# Patient Record
Sex: Male | Born: 1970 | Race: Black or African American | Hispanic: No | Marital: Married | Smoking: Never smoker
Health system: Southern US, Community
[De-identification: ages and names within clinical notes are randomized; demographics above are authoritative.]

## PROBLEM LIST (undated history)

## (undated) DIAGNOSIS — Z860100 Personal history of colon polyps, unspecified: Secondary | ICD-10-CM

## (undated) DIAGNOSIS — I10 Essential (primary) hypertension: Secondary | ICD-10-CM

## (undated) DIAGNOSIS — K649 Unspecified hemorrhoids: Secondary | ICD-10-CM

## (undated) DIAGNOSIS — E785 Hyperlipidemia, unspecified: Secondary | ICD-10-CM

## (undated) HISTORY — DX: Unspecified hemorrhoids: K64.9

## (undated) HISTORY — DX: Essential (primary) hypertension: I10

## (undated) HISTORY — PX: VASECTOMY: SHX75

## (undated) HISTORY — DX: Personal history of colon polyps, unspecified: Z86.0100

## (undated) HISTORY — DX: Personal history of colonic polyps: Z86.010

## (undated) HISTORY — PX: OTHER SURGICAL HISTORY: SHX169

## (undated) HISTORY — DX: Hyperlipidemia, unspecified: E78.5

---

## 2003-01-21 ENCOUNTER — Encounter: Admission: RE | Admit: 2003-01-21 | Discharge: 2003-01-21 | Payer: Self-pay | Admitting: Occupational Medicine

## 2005-05-23 ENCOUNTER — Emergency Department (HOSPITAL_COMMUNITY): Admission: EM | Admit: 2005-05-23 | Discharge: 2005-05-23 | Payer: Self-pay | Admitting: Emergency Medicine

## 2005-05-24 ENCOUNTER — Ambulatory Visit: Payer: Self-pay | Admitting: Orthopedic Surgery

## 2005-06-21 ENCOUNTER — Ambulatory Visit: Payer: Self-pay | Admitting: Orthopedic Surgery

## 2007-02-22 ENCOUNTER — Emergency Department (HOSPITAL_COMMUNITY): Admission: EM | Admit: 2007-02-22 | Discharge: 2007-02-22 | Payer: Self-pay | Admitting: Emergency Medicine

## 2010-08-21 ENCOUNTER — Ambulatory Visit (INDEPENDENT_AMBULATORY_CARE_PROVIDER_SITE_OTHER): Payer: BC Managed Care – PPO | Admitting: Urology

## 2010-08-21 DIAGNOSIS — Z3009 Encounter for other general counseling and advice on contraception: Secondary | ICD-10-CM

## 2010-09-25 ENCOUNTER — Encounter (INDEPENDENT_AMBULATORY_CARE_PROVIDER_SITE_OTHER): Payer: BC Managed Care – PPO | Admitting: Urology

## 2010-09-25 DIAGNOSIS — Z302 Encounter for sterilization: Secondary | ICD-10-CM

## 2010-12-11 LAB — URINALYSIS, ROUTINE W REFLEX MICROSCOPIC
Bilirubin Urine: NEGATIVE
Glucose, UA: NEGATIVE
Hgb urine dipstick: NEGATIVE
Ketones, ur: NEGATIVE
Nitrite: NEGATIVE
Protein, ur: NEGATIVE
Specific Gravity, Urine: 1.025
Urobilinogen, UA: 0.2
pH: 6

## 2011-06-14 ENCOUNTER — Other Ambulatory Visit (HOSPITAL_COMMUNITY): Payer: Self-pay | Admitting: Physical Medicine and Rehabilitation

## 2011-06-14 ENCOUNTER — Ambulatory Visit (HOSPITAL_COMMUNITY)
Admission: RE | Admit: 2011-06-14 | Discharge: 2011-06-14 | Disposition: A | Discharge status: Home / Self Care | Payer: 59 | Source: Ambulatory Visit | Attending: Physical Medicine and Rehabilitation | Admitting: Physical Medicine and Rehabilitation

## 2011-06-14 DIAGNOSIS — R209 Unspecified disturbances of skin sensation: Secondary | ICD-10-CM | POA: Insufficient documentation

## 2011-06-14 DIAGNOSIS — M25569 Pain in unspecified knee: Secondary | ICD-10-CM

## 2011-06-14 DIAGNOSIS — M79609 Pain in unspecified limb: Secondary | ICD-10-CM | POA: Insufficient documentation

## 2011-06-14 DIAGNOSIS — M25559 Pain in unspecified hip: Secondary | ICD-10-CM

## 2011-06-14 DIAGNOSIS — M545 Low back pain, unspecified: Secondary | ICD-10-CM | POA: Insufficient documentation

## 2011-07-08 ENCOUNTER — Other Ambulatory Visit (HOSPITAL_COMMUNITY): Payer: Self-pay | Admitting: Physical Medicine and Rehabilitation

## 2011-07-08 DIAGNOSIS — IMO0002 Reserved for concepts with insufficient information to code with codable children: Secondary | ICD-10-CM

## 2011-07-08 DIAGNOSIS — M79609 Pain in unspecified limb: Secondary | ICD-10-CM

## 2011-07-09 ENCOUNTER — Other Ambulatory Visit (HOSPITAL_COMMUNITY): Payer: 59

## 2011-07-09 ENCOUNTER — Ambulatory Visit (HOSPITAL_COMMUNITY)
Admission: RE | Admit: 2011-07-09 | Discharge: 2011-07-09 | Disposition: A | Discharge status: Home / Self Care | Payer: 59 | Source: Ambulatory Visit | Attending: Physical Medicine and Rehabilitation | Admitting: Physical Medicine and Rehabilitation

## 2011-07-09 DIAGNOSIS — M5126 Other intervertebral disc displacement, lumbar region: Secondary | ICD-10-CM | POA: Insufficient documentation

## 2011-07-09 DIAGNOSIS — IMO0002 Reserved for concepts with insufficient information to code with codable children: Secondary | ICD-10-CM

## 2011-07-09 DIAGNOSIS — R209 Unspecified disturbances of skin sensation: Secondary | ICD-10-CM | POA: Insufficient documentation

## 2011-07-09 DIAGNOSIS — M79609 Pain in unspecified limb: Secondary | ICD-10-CM | POA: Insufficient documentation

## 2011-07-09 DIAGNOSIS — M545 Low back pain, unspecified: Secondary | ICD-10-CM | POA: Insufficient documentation

## 2012-03-03 ENCOUNTER — Emergency Department (HOSPITAL_COMMUNITY)
Admission: EM | Admit: 2012-03-03 | Discharge: 2012-03-04 | Disposition: A | Discharge status: Home / Self Care | Payer: 59 | Attending: Emergency Medicine | Admitting: Emergency Medicine

## 2012-03-03 ENCOUNTER — Emergency Department (HOSPITAL_COMMUNITY): Payer: 59

## 2012-03-03 ENCOUNTER — Encounter (HOSPITAL_COMMUNITY): Payer: Self-pay | Admitting: Emergency Medicine

## 2012-03-03 DIAGNOSIS — B349 Viral infection, unspecified: Secondary | ICD-10-CM

## 2012-03-03 DIAGNOSIS — R51 Headache: Secondary | ICD-10-CM | POA: Insufficient documentation

## 2012-03-03 DIAGNOSIS — R197 Diarrhea, unspecified: Secondary | ICD-10-CM | POA: Insufficient documentation

## 2012-03-03 DIAGNOSIS — R52 Pain, unspecified: Secondary | ICD-10-CM | POA: Insufficient documentation

## 2012-03-03 DIAGNOSIS — R509 Fever, unspecified: Secondary | ICD-10-CM | POA: Insufficient documentation

## 2012-03-03 DIAGNOSIS — B9789 Other viral agents as the cause of diseases classified elsewhere: Secondary | ICD-10-CM | POA: Insufficient documentation

## 2012-03-03 DIAGNOSIS — R5381 Other malaise: Secondary | ICD-10-CM | POA: Insufficient documentation

## 2012-03-03 NOTE — ED Notes (Signed)
PT. REPORTS DRY COUGH SLIGHT SOB WITH FEVER ,HEADACHE AND BODY ACHES ONSET YESTERDAY UNRELIEVED BY OTC TYLENOL.

## 2012-03-04 MED ORDER — ALBUTEROL SULFATE HFA 108 (90 BASE) MCG/ACT IN AERS
2.0000 | INHALATION_SPRAY | RESPIRATORY_TRACT | Status: DC | PRN
  Administered 2012-03-04: 2 via RESPIRATORY_TRACT
  Filled 2012-03-04: qty 6.7

## 2012-03-04 MED ORDER — METOCLOPRAMIDE HCL 5 MG/ML IJ SOLN
10.0000 mg | Freq: Once | INTRAMUSCULAR | Status: AC
  Administered 2012-03-04: 10 mg via INTRAMUSCULAR
  Filled 2012-03-04: qty 2

## 2012-03-04 MED ORDER — HYDROCOD POLST-CHLORPHEN POLST 10-8 MG/5ML PO LQCR: 5.0000 mL | Freq: Two times a day (BID) | ORAL | Status: DC | PRN

## 2012-03-04 MED ORDER — IBUPROFEN 800 MG PO TABS: 800.0000 mg | ORAL_TABLET | Freq: Three times a day (TID) | ORAL | Status: DC

## 2012-03-04 NOTE — ED Provider Notes (Signed)
History     CSN: [id number]  Arrival date & time 03/03/12  2109   None     Chief Complaint  Patient presents with  . Cough    (Consider location/radiation/quality/duration/timing/severity/associated sxs/prior treatment) HPI History provided by pt.   Pt has had a cough for the past 2 days.  Associated w/ frontal headache, body aches, mild diarrhea and fever, max temp 102.  Denies nasal congestion, rhinorrhea, sore throat, CP, SOB and N/V.  Has taken theraflu w/ some relief.  No known sick contacts.  Immunocompetent.   History reviewed. No pertinent past medical history.  History reviewed. No pertinent past surgical history.  No family history on file.  History  Substance Use Topics  . Smoking status: Never Smoker   . Smokeless tobacco: Not on file  . Alcohol Use: No      Review of Systems  All other systems reviewed and are negative.    Allergies  Review of patient's allergies indicates no known allergies.  Home Medications   Current Outpatient Rx  Name  Route  Sig  Dispense  Refill  . VERAPAMIL HCL ER 240 MG PO TBCR   Oral   Take 240 mg by mouth daily.         [name] Kitchen HYDROCOD POLST-CPM POLST ER 10-8 MG/5ML PO LQCR   Oral   Take 5 mLs by mouth every 12 (twelve) hours as needed.   60 mL   0   . IBUPROFEN 800 MG PO TABS   Oral   Take 1 tablet (800 mg total) by mouth 3 (three) times daily.   12 tablet   0     BP 135/75  Pulse 91  Temp 98.7 F (37.1 C) (Oral)  Resp 18  SpO2 96%  Physical Exam  Nursing note and vitals reviewed. Constitutional: He is oriented to person, place, and time. He appears well-developed and well-nourished. No distress.  HENT:  Head: Normocephalic and atraumatic. No trismus in the jaw.  Mouth/Throat: Uvula is midline and mucous membranes are normal. No posterior oropharyngeal edema or posterior oropharyngeal erythema.         No erythema of posterior pharynx. Tonsils symmetric and w/out edema/exudate.  Uvula mid-line.  No  trismus.  No nasal discharge.   Eyes:       Normal appearance  Neck: Normal range of motion. Neck supple.  Cardiovascular: Normal rate and regular rhythm.   Pulmonary/Chest: Effort normal and breath sounds normal.       minimal coughing  Musculoskeletal: Normal range of motion.  Lymphadenopathy:    He has no cervical adenopathy.  Neurological: He is alert and oriented to person, place, and time.  Skin: Skin is warm and dry. No rash noted.  Psychiatric: He has a normal mood and affect. His behavior is normal.    ED Course  Procedures (including critical care time)  Labs Reviewed - No data to display Dg Chest 2 View  03/03/2012  *RADIOLOGY REPORT*  Clinical Data: Cough  CHEST - 2 VIEW  Comparison: None  Findings: Mild cardiac enlargement.  No effusions or edema.  No airspace consolidation identified.  Review of the visualized osseous structures is unremarkable.  IMPRESSION:  1.  No acute cardiopulmonary abnormalities.   Original Report Authenticated By: Signa Kell, M.D.      1. Viral syndrome       MDM  Healthy 41yo M presents w/ s/sx most consistent w/ viral URI, likely influenza.  Afebrile, well-appearing, no focal neuro deficits or  meningeal signs, no respiratory distress on exam.  CXR neg.  Received IM reglan for headache.  D/c'd home w/ tussionex and albuterol inhaler.  Return precautions discussed.         Otilio Miu, PA-C 03/04/12 [phone]

## 2012-03-07 NOTE — ED Provider Notes (Signed)
Medical screening examination/treatment/procedure(s) were performed by non-physician practitioner and as supervising physician I was immediately available for consultation/collaboration.  Kammie Scioli K Givanni Staron, MD 03/07/12 0826 

## 2012-07-16 ENCOUNTER — Encounter (HOSPITAL_COMMUNITY): Payer: Self-pay | Admitting: Emergency Medicine

## 2012-07-16 ENCOUNTER — Emergency Department (HOSPITAL_COMMUNITY): Payer: Managed Care, Other (non HMO)

## 2012-07-16 ENCOUNTER — Emergency Department (HOSPITAL_COMMUNITY)
Admission: EM | Admit: 2012-07-16 | Discharge: 2012-07-16 | Disposition: A | Discharge status: Home / Self Care | Payer: Managed Care, Other (non HMO) | Attending: Emergency Medicine | Admitting: Emergency Medicine

## 2012-07-16 DIAGNOSIS — S93609A Unspecified sprain of unspecified foot, initial encounter: Secondary | ICD-10-CM | POA: Insufficient documentation

## 2012-07-16 DIAGNOSIS — Y9239 Other specified sports and athletic area as the place of occurrence of the external cause: Secondary | ICD-10-CM | POA: Insufficient documentation

## 2012-07-16 DIAGNOSIS — Z79899 Other long term (current) drug therapy: Secondary | ICD-10-CM | POA: Insufficient documentation

## 2012-07-16 DIAGNOSIS — X500XXA Overexertion from strenuous movement or load, initial encounter: Secondary | ICD-10-CM | POA: Insufficient documentation

## 2012-07-16 DIAGNOSIS — S96911A Strain of unspecified muscle and tendon at ankle and foot level, right foot, initial encounter: Secondary | ICD-10-CM

## 2012-07-16 DIAGNOSIS — Z791 Long term (current) use of non-steroidal anti-inflammatories (NSAID): Secondary | ICD-10-CM | POA: Insufficient documentation

## 2012-07-16 DIAGNOSIS — Y9364 Activity, baseball: Secondary | ICD-10-CM | POA: Insufficient documentation

## 2012-07-16 NOTE — ED Notes (Signed)
Patient states he was playing softball yesterday and states he felt and heard a pop on the bottom of his right foot in his arch.

## 2012-07-16 NOTE — ED Provider Notes (Signed)
History  This chart was scribed for Geoffery Lyons, MD by Shari Heritage, ED Scribe. The patient was seen in room APA07/APA07. Patient's care was started at 0703.   CSN: [id number]  Arrival date & time 07/16/12  4098   First MD Initiated Contact with Patient 07/16/12 0703      Chief Complaint  Patient presents with  . Foot Pain    The history is provided by the patient. No language interpreter was used.    HPI Comments: Danny Rivers is a 42 y.o. male who presents to the Emergency Department complaining of moderate, constant, non-radiating right foot pain to the plantar surface of the heel onset yesterday. Patient states that pain began when he was running while playing softball and felt "something pop." He denies any ankle pain or dorsal foot pain. He has no history of prior injury to the area. There is no numbness, weakness, nausea, vomiting, fever or chills. Patient has no chronic medical conditions. Patient does not smoke or use smokeless tobacco.  History reviewed. No pertinent past medical history.  History reviewed. No pertinent past surgical history.  No family history on file.  History  Substance Use Topics  . Smoking status: Never Smoker   . Smokeless tobacco: Not on file  . Alcohol Use: Yes      Review of Systems  Constitutional: Negative for fever and chills.  Gastrointestinal: Negative for nausea and vomiting.  Neurological: Negative for weakness and numbness.  Hematological: Negative.     Allergies  Review of patient's allergies indicates no known allergies.  Home Medications   Current Outpatient Rx  Name  Route  Sig  Dispense  Refill  . chlorpheniramine-HYDROcodone (TUSSIONEX PENNKINETIC ER) 10-8 MG/5ML LQCR   Oral   Take 5 mLs by mouth every 12 (twelve) hours as needed.   60 mL   0   . ibuprofen (ADVIL,MOTRIN) 800 MG tablet   Oral   Take 1 tablet (800 mg total) by mouth 3 (three) times daily.   12 tablet   0   . verapamil (CALAN-SR) 240 MG CR  tablet   Oral   Take 240 mg by mouth daily.           Triage Vitals: BP 141/87  Pulse 87  Temp(Src) 98.8 F (37.1 C) (Oral)  Resp 20  Ht 6\' 2"  (1.88 m)  Wt 236 lb (107.049 kg)  BMI 30.29 kg/m2  SpO2 98%  Physical Exam  Constitutional: He is oriented to person, place, and time. He appears well-developed and well-nourished.  HENT:  Head: Normocephalic and atraumatic.  Eyes: Conjunctivae and EOM are normal. Pupils are equal, round, and reactive to light.  Neck: Normal range of motion. Neck supple.  Pulmonary/Chest: No respiratory distress.  Abdominal: He exhibits no distension.  Musculoskeletal: Normal range of motion.       Right foot: He exhibits tenderness and swelling.  Right foot is noted to have tenderness and swelling on the plantar aspect just medial and distal to the calcaneus. No ecchymosis.   Neurological: He is alert and oriented to person, place, and time.  Skin: Skin is warm and dry. No rash noted. No erythema.    ED Course  Procedures (including critical care time) DIAGNOSTIC STUDIES: Oxygen Saturation is 98% on room air, normal by my interpretation.    COORDINATION OF CARE: 7:11 AM- Patient informed of current plan for treatment and evaluation and agrees with plan at this time.    No results found.   No  diagnosis found.    MDM  Xrays are negative.  Strain of arch of foot, will treat with ice, rest, nsaids.  Follow up prn.      I personally performed the services described in this documentation, which was scribed in my presence. The recorded information has been reviewed and is accurate.     Geoffery Lyons, MD 07/16/12 [phone]

## 2013-05-10 ENCOUNTER — Ambulatory Visit (INDEPENDENT_AMBULATORY_CARE_PROVIDER_SITE_OTHER): Payer: BC Managed Care – PPO | Admitting: Family Medicine

## 2013-05-10 ENCOUNTER — Encounter: Payer: Self-pay | Admitting: Family Medicine

## 2013-05-10 VITALS — BP 122/82 | Temp 98.3°F | Ht 74.0 in | Wt 246.5 lb

## 2013-05-10 DIAGNOSIS — K5289 Other specified noninfective gastroenteritis and colitis: Secondary | ICD-10-CM

## 2013-05-10 DIAGNOSIS — K529 Noninfective gastroenteritis and colitis, unspecified: Secondary | ICD-10-CM

## 2013-05-10 MED ORDER — DIPHENOXYLATE-ATROPINE 2.5-0.025 MG PO TABS: 1.0000 | ORAL_TABLET | Freq: Four times a day (QID) | ORAL | Status: DC | PRN

## 2013-05-10 MED ORDER — ONDANSETRON 4 MG PO TBDP: 4.0000 mg | ORAL_TABLET | Freq: Four times a day (QID) | ORAL | Status: DC | PRN

## 2013-05-10 NOTE — Progress Notes (Signed)
   Subjective:    Patient ID: Danny Rivers, male    DOB: 05-Sep-1970, 43 y.o.   MRN: [id number]  Diarrhea  This is a new problem. The current episode started in the past 7 days. The problem has been unchanged. The stool consistency is described as watery. Associated symptoms include bloating, a fever, headaches and vomiting. Nothing aggravates the symptoms. There are no known risk factors. He has tried increased fluids for the symptoms. The treatment provided no relief.   Patient states he has no other concerns at this time during this visit.  Started sat night  Felt real nauseated and bad  Got hot and clammy, then vom from that  Diarrhea Sunday  Terrible diarrhe this wk  Stomach bubbling and gassy  No meds  Nausea still comes and goes, vom first two days  Appetite soso  Review of Systems  Constitutional: Positive for fever.  Gastrointestinal: Positive for vomiting, diarrhea and bloating.  Neurological: Positive for headaches.   no fever no chest pain no back pain no cough otherwise negative     Objective:   Physical Exam Alert hydration good. Vital stable. HEENT normal. Lungs clear. Heart regular in rhythm. Abdomen hyperactive bowel sounds. Diffuse mild tenderness. No discrete tenderness.       Assessment & Plan:  Impression viral gastroenteritis plan Lomotil when necessary. Zofran when necessary. Diet discussed warning signs discussed. [facility]

## 2013-07-25 IMAGING — CR DG FOOT COMPLETE 3+V*R*
3 series · 3 of 3 positions shown · non-contrast
Comparison: None

CLINICAL DATA: Right foot injury and pain.

RIGHT FOOT COMPLETE - 3+ VIEW

[view not recorded (1 of 3)]
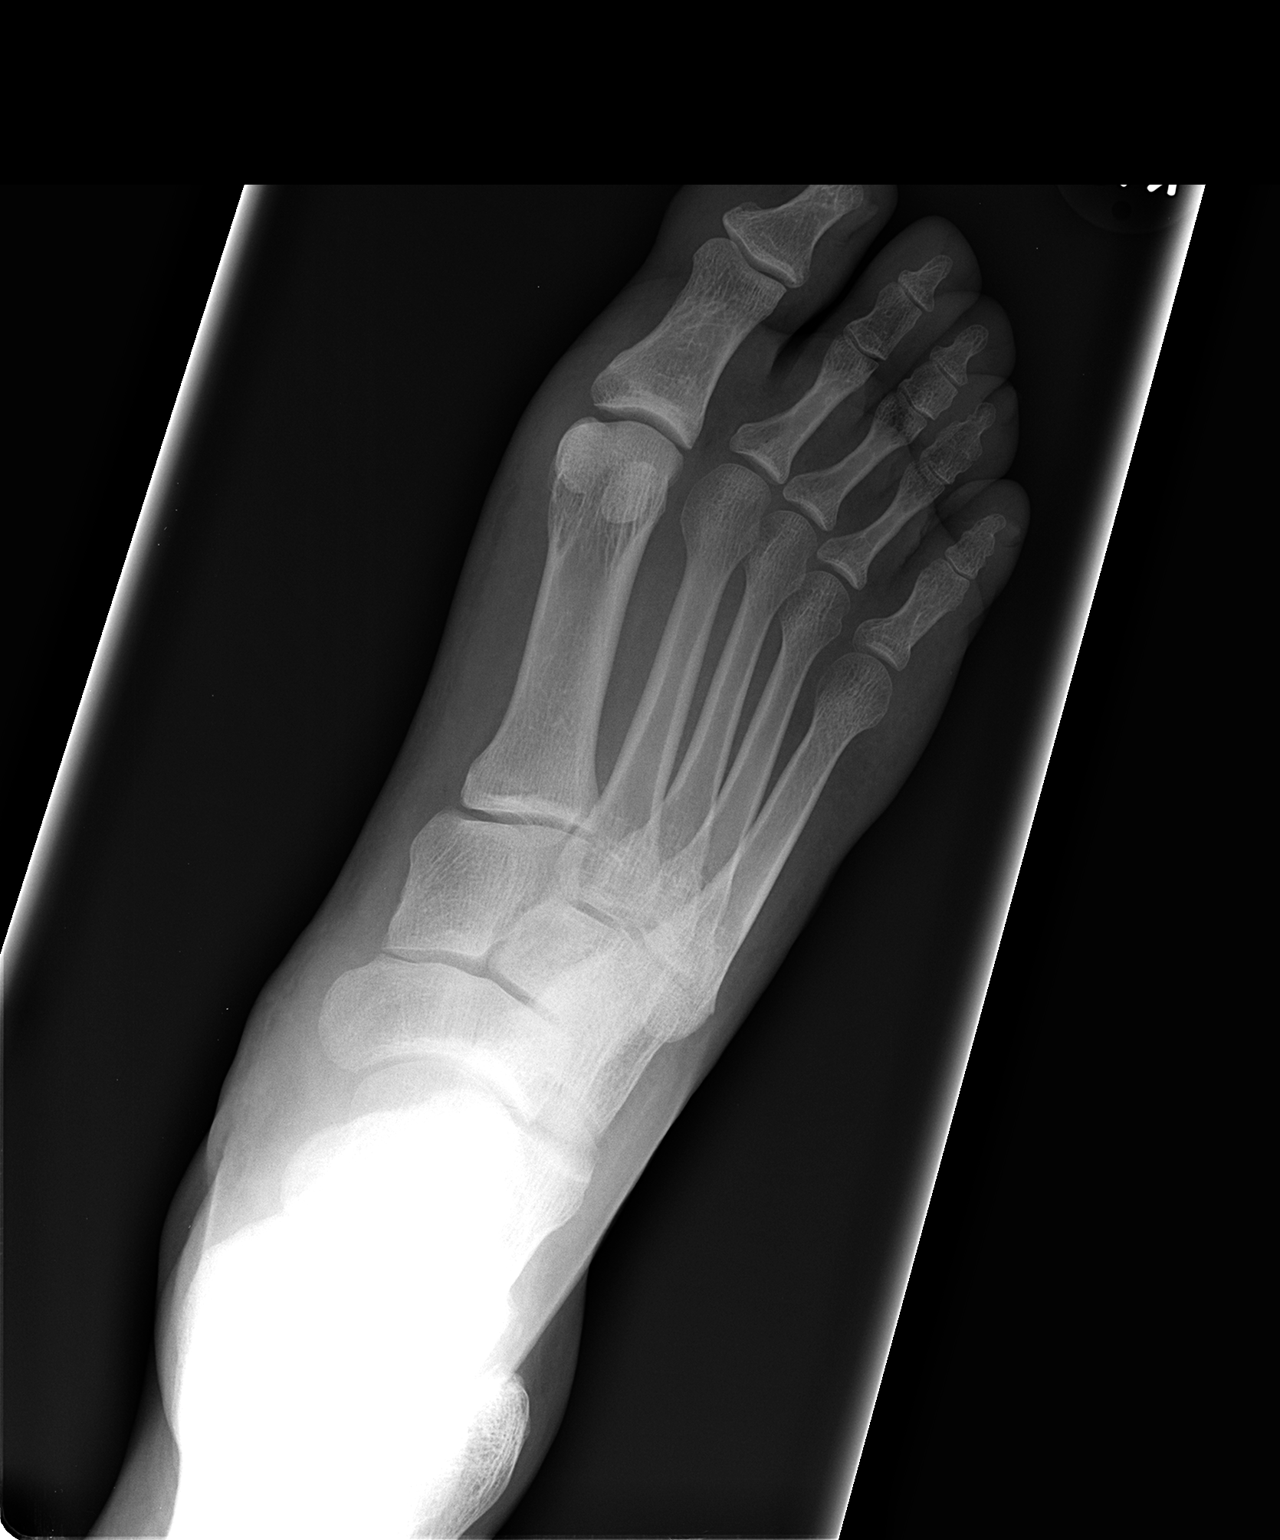

[view not recorded (2 of 3)]
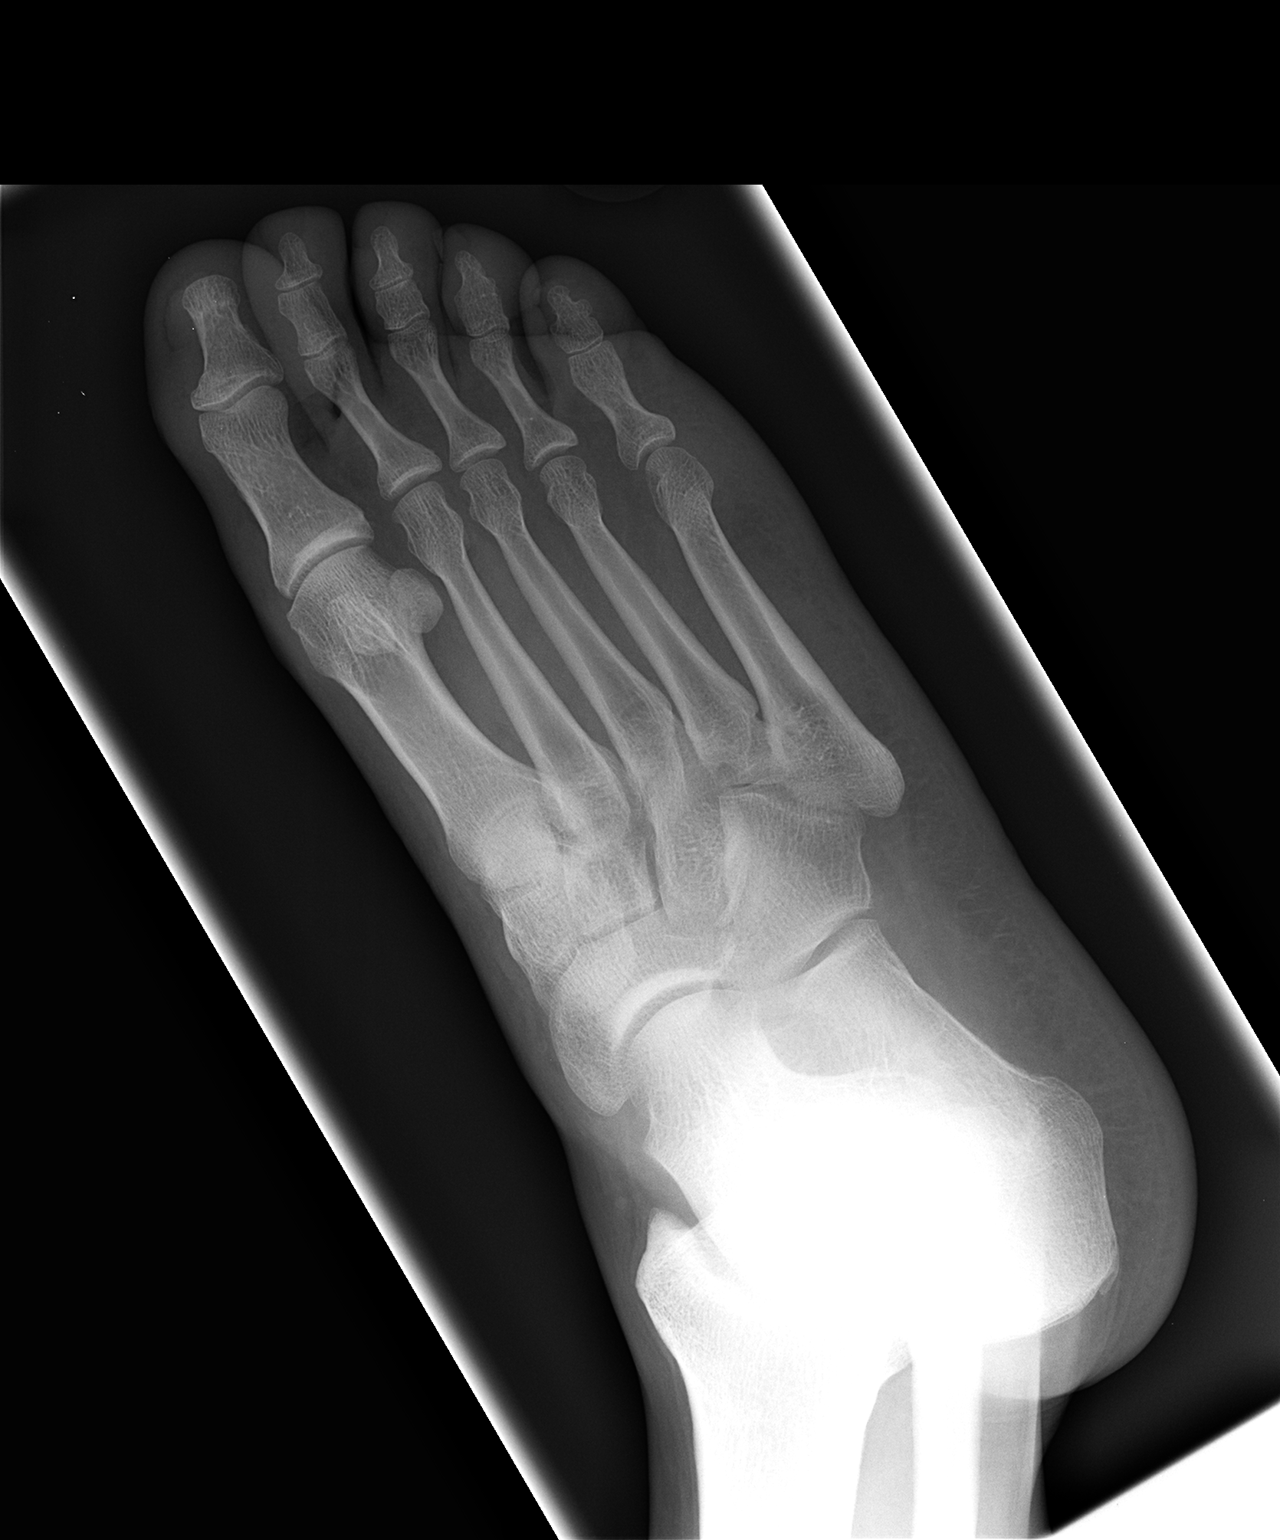

[view not recorded (3 of 3)]
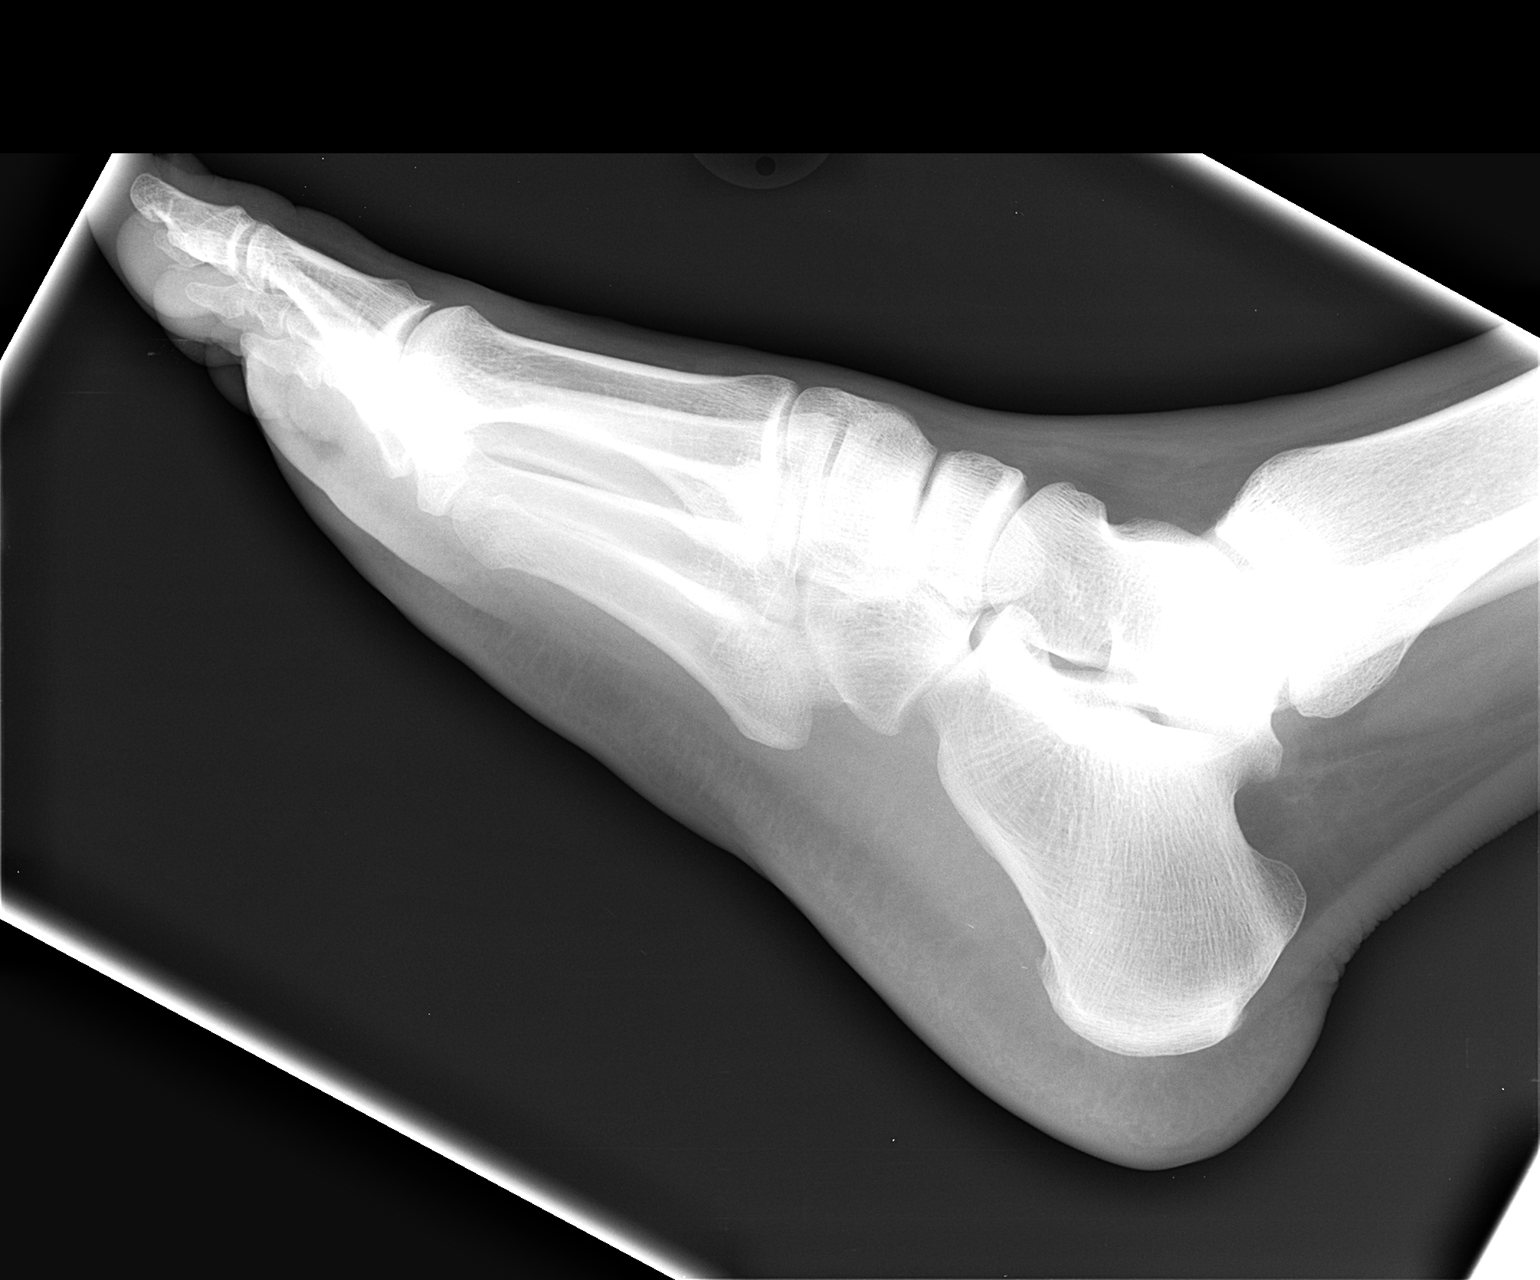

[3 of 3 positions shown; findings below may reference images not displayed]

FINDINGS: There is no evidence of acute fracture, subluxation, or
dislocation.
The Lisfranc joints are intact.
No focal bony lesions are identified.
There is no evidence of radiopaque foreign body.

The joint spaces are unremarkable.
IMPRESSION: No evidence of bony abnormality.

## 2015-03-09 HISTORY — PX: HEMORRHOID BANDING: SHX5850

## 2015-05-08 ENCOUNTER — Encounter: Payer: Self-pay | Admitting: Family Medicine

## 2015-05-08 ENCOUNTER — Ambulatory Visit (INDEPENDENT_AMBULATORY_CARE_PROVIDER_SITE_OTHER): Payer: BLUE CROSS/BLUE SHIELD | Admitting: Family Medicine

## 2015-05-08 VITALS — BP 142/84 | Temp 98.0°F | Ht 74.0 in | Wt 272.4 lb

## 2015-05-08 DIAGNOSIS — Z139 Encounter for screening, unspecified: Secondary | ICD-10-CM | POA: Diagnosis not present

## 2015-05-08 DIAGNOSIS — K921 Melena: Secondary | ICD-10-CM

## 2015-05-08 DIAGNOSIS — K644 Residual hemorrhoidal skin tags: Secondary | ICD-10-CM

## 2015-05-08 DIAGNOSIS — K648 Other hemorrhoids: Secondary | ICD-10-CM | POA: Diagnosis not present

## 2015-05-08 NOTE — Progress Notes (Signed)
   Subjective:    Patient ID: Danny Rivers, male    DOB: [birth year], 45 y.o.   MRN: [id number]  HPI Patient in today for blood in stools. Onset 5 years ago. Patient states symptoms come and go. States amounts vary. Has hx of hemorrhoids.  Pt has intermittent blood sometimes not to discolor the commode. Often bright red but sometimes dark. No major abdominal pain. No perirectal pain. Positive intermittent constipation. Positive known hemorrhoids. This is been going on but worse in recent year.  mixtur  tingred   No pain.  States no other concerns this visit.  Once twice per wk  No colon canc er  No prostate cancer  [phone]  Review of Systems No headache no chest pain no back pain no abdominal pain no change in bowel habits other than noted    Objective:   Physical Exam Alert vitals stable blood pressure initially elevated fine on repeat HEENT normal lungs clear heart rare rhythm rectal exam impressive external hemorrhoids prostate gland within normal limits       Assessment & Plan:  Impression hematochezia by history #2 significant external hemorrhoids with potential internal hemorrhoids #3 elevated blood pressure resolved plan long discussion held. No bleeding likely coming from hemorrhoids cannot be 100% sure. In addition hemorrhoids are better enough to get input from the GI doctors about potential interventions [facility] GI referral rationale discussed multiple questions answered 25 minutes spent most in discussion

## 2015-05-12 ENCOUNTER — Encounter: Payer: Self-pay | Admitting: Family Medicine

## 2015-05-13 ENCOUNTER — Encounter: Payer: Self-pay | Admitting: Gastroenterology

## 2015-05-22 ENCOUNTER — Ambulatory Visit (INDEPENDENT_AMBULATORY_CARE_PROVIDER_SITE_OTHER): Payer: BLUE CROSS/BLUE SHIELD | Admitting: Family Medicine

## 2015-05-22 ENCOUNTER — Encounter: Payer: Self-pay | Admitting: Family Medicine

## 2015-05-22 VITALS — BP 142/86 | Temp 98.6°F | Ht 74.0 in | Wt 266.0 lb

## 2015-05-22 DIAGNOSIS — J329 Chronic sinusitis, unspecified: Secondary | ICD-10-CM

## 2015-05-22 MED ORDER — AMOXICILLIN-POT CLAVULANATE 875-125 MG PO TABS: 1.0000 | ORAL_TABLET | Freq: Two times a day (BID) | ORAL | Status: AC

## 2015-05-22 MED ORDER — ALBUTEROL SULFATE HFA 108 (90 BASE) MCG/ACT IN AERS: 2.0000 | INHALATION_SPRAY | Freq: Four times a day (QID) | RESPIRATORY_TRACT | Status: DC | PRN

## 2015-05-22 NOTE — Progress Notes (Signed)
   Subjective:    Patient ID: Danny Rivers, male    DOB: 1970/11/27, 45 y.o.   MRN: [id number]  Cough This is a new problem. Episode onset: 4 days. Associated symptoms include headaches, nasal congestion and wheezing. Treatments tried: mucinex, tylenol.    Sneezing and dranage  Bad headache over all   Tylenol for h a    No body aches   Energy level  appet ite ok  Cough bad at times  Review of Systems  Respiratory: Positive for cough and wheezing.   Neurological: Positive for headaches.       Objective:   Physical Exam   Alert, mild malaise. Hydration good Vitals stable. frontal/ maxillary tenderness evident positive nasal congestion. pharynx normal neck supple  lungs mild wheeziness with cough otherwise clear/no crackles or wheezes. heart regular in rhythm      Assessment & Plan:  Impression rhinosinusitis/bronchitis with reactive airways plan antibiotics prescribed. Symptom care discussed albuterol when necessary for wheezing [facility]

## 2015-05-26 ENCOUNTER — Ambulatory Visit (INDEPENDENT_AMBULATORY_CARE_PROVIDER_SITE_OTHER): Payer: BLUE CROSS/BLUE SHIELD | Admitting: Gastroenterology

## 2015-05-26 ENCOUNTER — Encounter: Payer: Self-pay | Admitting: Gastroenterology

## 2015-05-26 ENCOUNTER — Other Ambulatory Visit: Payer: Self-pay

## 2015-05-26 VITALS — BP 162/89 | HR 69 | Temp 97.3°F | Ht 74.0 in | Wt 266.2 lb

## 2015-05-26 DIAGNOSIS — K625 Hemorrhage of anus and rectum: Secondary | ICD-10-CM | POA: Diagnosis not present

## 2015-05-26 DIAGNOSIS — K649 Unspecified hemorrhoids: Secondary | ICD-10-CM | POA: Diagnosis not present

## 2015-05-26 HISTORY — DX: Hemorrhage of anus and rectum: K62.5

## 2015-05-26 MED ORDER — PEG 3350-KCL-NA BICARB-NACL 420 G PO SOLR: 4000.0000 mL | Freq: Once | ORAL | Status: DC

## 2015-05-26 NOTE — Patient Instructions (Signed)
1. Colonoscopy with Dr. Rourk. Please see separate instructions. 

## 2015-05-26 NOTE — Assessment & Plan Note (Signed)
45 year old gentleman with chronic intermittent rectal bleeding likely due to benign anorectal source, hemorrhoids. No prior colonoscopy. Recommend colonoscopy for diagnostic purposes. Discussed with patient, based on findings, he may be a candidate for office based hemorrhoid banding.  I have discussed the risks, alternatives, benefits with regards to but not limited to the risk of reaction to medication, bleeding, infection, perforation and the patient is agreeable to proceed. Written consent to be obtained.

## 2015-05-26 NOTE — Progress Notes (Addendum)
Primary Care Physician:  Mickie Hillier, MD  Primary Gastroenterologist:  Garfield Cornea, MD   Chief Complaint  Patient presents with  . Hemorrhoids    HPI:  Danny Rivers is a 45 y.o. male here at the request of Dr. Mickie Hillier for further evaluation of rectal bleeding and hemorrhoids. Patient gives history of rectal bleeding this been intermittent for several years. Sees blood in the stool, on the tissue. Sometimes stains his underwear. Denies any rectal pain, constipation, diarrhea, abdominal pain, upper GI symptoms. No weight loss. No family history of colon cancer. Patient prefers Dr. Gala Romney, because he personally knows Dr. Oneida Alar. Recent digital rectal exam by PCP with "impressive external hemorrhoids".     Current Outpatient Prescriptions  Medication Sig Dispense Refill  . albuterol (PROVENTIL HFA;VENTOLIN HFA) 108 (90 Base) MCG/ACT inhaler Inhale 2 puffs into the lungs every 6 (six) hours as needed for wheezing or shortness of breath. 1 Inhaler 2  . amoxicillin-clavulanate (AUGMENTIN) 875-125 MG tablet Take 1 tablet by mouth 2 (two) times daily. 20 tablet 0  . polyethylene glycol-electrolytes (NULYTELY/GOLYTELY) 420 g solution Take 4,000 mLs by mouth once. 4000 mL 0   No current facility-administered medications for this visit.    Allergies as of 05/26/2015  . (No Known Allergies)    No past medical history on file.  Past Surgical History  Procedure Laterality Date  . Vastectomy      Family History  Problem Relation Age of Onset  . Colon cancer Neg Hx     Social History   Social History  . Marital Status: Divorced    Spouse Name: N/A  . Number of Children: 5  . Years of Education: N/A   Occupational History  . [employer]    Social History Main Topics  . Smoking status: Never Smoker   . Smokeless tobacco: Not on file  . Alcohol Use: No  . Drug Use: No  . Sexual Activity: Not on file   Other Topics Concern  . Not on file   Social History  Narrative      ROS:  General: Negative for anorexia, weight loss, fever, chills, fatigue, weakness. Eyes: Negative for vision changes.  ENT: Negative for hoarseness, difficulty swallowing , nasal congestion. CV: Negative for chest pain, angina, palpitations, dyspnea on exertion, peripheral edema.  Respiratory: Negative for dyspnea at rest, dyspnea on exertion, cough, sputum, wheezing.  GI: See history of present illness. GU:  Negative for dysuria, hematuria, urinary incontinence, urinary frequency, nocturnal urination.  MS: Negative for joint pain, low back pain.  Derm: Negative for rash or itching.  Neuro: Negative for weakness, abnormal sensation, seizure, frequent headaches, memory loss, confusion.  Psych: Negative for anxiety, depression, suicidal ideation, hallucinations.  Endo: Negative for unusual weight change.  Heme: Negative for bruising or bleeding. Allergy: Negative for rash or hives.    Physical Examination:  BP 162/89 mmHg  Pulse 69  Temp(Src) 97.3 F (36.3 C) (Oral)  Ht 6\' 2"  (1.88 m)  Wt 266 lb 3.2 oz (120.748 kg)  BMI 34.16 kg/m2   General: Well-nourished, well-developed in no acute distress.  Head: Normocephalic, atraumatic.   Eyes: Conjunctiva pink, no icterus. Mouth: Oropharyngeal mucosa moist and pink , no lesions erythema or exudate. Neck: Supple without thyromegaly, masses, or lymphadenopathy.  Lungs: Clear to auscultation bilaterally.  Heart: Regular rate and rhythm, no murmurs rubs or gallops.  Abdomen: Bowel sounds are normal, nontender, nondistended, no hepatosplenomegaly or masses, no abdominal bruits or    hernia ,  no rebound or guarding.   Rectal: Deferred Extremities: No lower extremity edema. No clubbing or deformities.  Neuro: Alert and oriented x 4 , grossly normal neurologically.  Skin: Warm and dry, no rash or jaundice.   Psych: Alert and cooperative, normal mood and affect.  Labs: No results found for: CREATININE, BUN, NA, K, CL,  CO2 No results found for: ALT, AST, GGT, ALKPHOS, BILITOT   Imaging Studies: No results found.

## 2015-05-26 NOTE — Progress Notes (Signed)
CC'ED TO PCP 

## 2015-06-03 ENCOUNTER — Ambulatory Visit: Payer: Managed Care, Other (non HMO) | Admitting: Nurse Practitioner

## 2015-06-13 ENCOUNTER — Encounter (HOSPITAL_COMMUNITY)
Admission: RE | Disposition: A | Discharge status: Home Health | Payer: Self-pay | Source: Ambulatory Visit | Attending: Internal Medicine

## 2015-06-13 ENCOUNTER — Encounter (HOSPITAL_COMMUNITY): Payer: Self-pay | Admitting: *Deleted

## 2015-06-13 ENCOUNTER — Ambulatory Visit (HOSPITAL_COMMUNITY)
Admission: RE | Admit: 2015-06-13 | Discharge: 2015-06-13 | Disposition: A | Discharge status: Home Health | Payer: BLUE CROSS/BLUE SHIELD | Source: Ambulatory Visit | Attending: Internal Medicine | Admitting: Internal Medicine

## 2015-06-13 DIAGNOSIS — Z860100 Personal history of colon polyps, unspecified: Secondary | ICD-10-CM | POA: Insufficient documentation

## 2015-06-13 DIAGNOSIS — D120 Benign neoplasm of cecum: Secondary | ICD-10-CM

## 2015-06-13 DIAGNOSIS — Q438 Other specified congenital malformations of intestine: Secondary | ICD-10-CM | POA: Diagnosis not present

## 2015-06-13 DIAGNOSIS — K649 Unspecified hemorrhoids: Secondary | ICD-10-CM

## 2015-06-13 DIAGNOSIS — K642 Third degree hemorrhoids: Secondary | ICD-10-CM | POA: Insufficient documentation

## 2015-06-13 DIAGNOSIS — K921 Melena: Secondary | ICD-10-CM | POA: Diagnosis not present

## 2015-06-13 DIAGNOSIS — K625 Hemorrhage of anus and rectum: Secondary | ICD-10-CM

## 2015-06-13 HISTORY — PX: COLONOSCOPY: SHX5424

## 2015-06-13 SURGERY — COLONOSCOPY
Anesthesia: Moderate Sedation

## 2015-06-13 MED ORDER — STERILE WATER FOR IRRIGATION IR SOLN
Status: DC | PRN
  Administered 2015-06-13: 14:00:00

## 2015-06-13 MED ORDER — SODIUM CHLORIDE 0.9 % IV SOLN
INTRAVENOUS | Status: DC
  Administered 2015-06-13: 13:00:00 via INTRAVENOUS

## 2015-06-13 MED ORDER — ONDANSETRON HCL 4 MG/2ML IJ SOLN
INTRAMUSCULAR | Status: AC
  Filled 2015-06-13: qty 2

## 2015-06-13 MED ORDER — MIDAZOLAM HCL 5 MG/5ML IJ SOLN
INTRAMUSCULAR | Status: DC | PRN
  Administered 2015-06-13 (×2): 1 mg via INTRAVENOUS
  Administered 2015-06-13 (×2): 2 mg via INTRAVENOUS

## 2015-06-13 MED ORDER — MEPERIDINE HCL 100 MG/ML IJ SOLN
INTRAMUSCULAR | Status: DC | PRN
  Administered 2015-06-13: 50 mg via INTRAVENOUS
  Administered 2015-06-13 (×2): 25 mg via INTRAVENOUS

## 2015-06-13 MED ORDER — ONDANSETRON HCL 4 MG/2ML IJ SOLN
INTRAMUSCULAR | Status: DC | PRN
  Administered 2015-06-13: 4 mg via INTRAVENOUS

## 2015-06-13 MED ORDER — MEPERIDINE HCL 100 MG/ML IJ SOLN
INTRAMUSCULAR | Status: AC
  Filled 2015-06-13: qty 2

## 2015-06-13 MED ORDER — MIDAZOLAM HCL 5 MG/5ML IJ SOLN
INTRAMUSCULAR | Status: AC
  Filled 2015-06-13: qty 10

## 2015-06-13 NOTE — Interval H&P Note (Signed)
History and Physical Interval Note:  06/13/2015 1:29 PM  Danny Rivers  has presented today for surgery, with the diagnosis of rectal bleeding, hemorrhoids  The various methods of treatment have been discussed with the patient and family. After consideration of risks, benefits and other options for treatment, the patient has consented to  Procedure(s) with comments: COLONOSCOPY (N/A) - 1400 as a surgical intervention .  The patient's history has been reviewed, patient examined, no change in status, stable for surgery.  I have reviewed the patient's chart and labs.  Questions were answered to the patient's satisfaction.     No change. Diagnostic colonoscopy plan. The risks, benefits, limitations, alternatives and imponderables have been reviewed with the patient. Questions have been answered. All parties are agreeable.   Manus Rudd

## 2015-06-13 NOTE — Op Note (Signed)
[facility] Patient Name: Danny Rivers Procedure Date: 06/13/2015 1:23 PM MRN: [id number] Date of Birth: Jul 26, 1970 Attending MD: Norvel Richards , MD CSN: [id number] Age: 45 Admit Type: Outpatient Procedure:                Colonoscopy Indications:              Hematochezia Providers:                Norvel Richards, MD, Gwenlyn Fudge, RN, Janeece Riggers, RN Referring MD:             Rosemary Holms M.D. Medicines:                Midazolam 6 mg IV, Meperidine 100 mg IV,                            Ondansetron 4 mg IV Complications:            No immediate complications. Estimated Blood Loss:     Estimated blood loss: none. Estimated blood loss                            was minimal. Procedure:                Pre-Anesthesia Assessment:                           - Prior to the procedure, a History and Physical                            was performed, and patient medications and                            allergies were reviewed. The patient's tolerance of                            previous anesthesia was also reviewed. The risks                            and benefits of the procedure and the sedation                            options and risks were discussed with the patient.                            All questions were answered, and informed consent                            was obtained. Prior Anticoagulants: The patient has                            taken no previous anticoagulant or antiplatelet                            agents.  ASA Grade Assessment: II - A patient with                            mild systemic disease. After reviewing the risks                            and benefits, the patient was deemed in                            satisfactory condition to undergo the procedure.                           After obtaining informed consent, the colonoscope                            was passed under direct vision. Throughout the                        procedure, the patient's blood pressure, pulse, and                            oxygen saturations were monitored continuously. The                            [id number] scope was introduced through                            the anus and advanced to the the cecum, identified                            by appendiceal orifice and ileocecal valve. The                            colonoscopy was somewhat difficult. Successful                            completion of the procedure was aided by performing                            the maneuvers documented (below) in this report.                            The quality of the bowel preparation was [Prep                            Quality]. The ileocecal valve, appendiceal orifice,                            and rectum were photographed. The quality of the                            bowel preparation was adequate. Scope In: 1:37:41 PM Scope Out: 2:08:58 PM Total Procedure Duration: 0 hours 31 minutes 17 seconds  Findings:  The perianal and digital rectal examinations were normal.      The colon (entire examined portion) was moderately tortuous. Advancing       the scope required changing the patient's position and using manual       pressure. .      A 3 mm polyp was found in the cecum. The polyp was sessile. The polyp       was removed with a cold biopsy forceps. Resection and retrieval were       complete. Estimated blood loss was minimal.      Non-bleeding hemorrhoids were found during digital exam. The hemorrhoids       were moderate, medium-sized and Grade III (internal hemorrhoids that       prolapse but require manual reduction). Impression:               - Tortuous colon. Rectal bleeding likely related to                            hemorrhoids                           - One 3 mm polyp in the cecum, removed with a cold                            biopsy forceps. Resected and retrieved. Moderate Sedation:       Moderate (conscious) sedation was administered by the endoscopy nurse       and supervised by the endoscopist. The following parameters were       monitored: oxygen saturation, heart rate, blood pressure, respiratory       rate, EKG, adequacy of pulmonary ventilation, and response to care.       Total physician intraservice time was 37 minutes. Recommendation:           - Await pathology results.                           - Repeat colonoscopy in 10 years for screening                            purposes.                           - Return to GI office in 2 months.                           - Advance diet as tolerated.                           - Manage constipation. Begin Benefiber 1 tablespoon                            twice daily. Office visit in 6 weeks for hemorrhoid                            banding                           - Use Benefiber [1  tsp] [PO] [BID] [Duration].                           - Advance diet as tolerated today. Procedure Code(s):        --- Professional ---                           [phone], Colonoscopy, flexible; with biopsy, single                            or multiple                           99152, Moderate sedation services provided by the                            same physician or other qualified health care                            professional performing the diagnostic or                            therapeutic service that the sedation supports,                            requiring the presence of an independent trained                            observer to assist in the monitoring of the                            patient's level of consciousness and physiological                            status; initial 15 minutes of intraservice time,                            patient age 95 years or older                           [phone], Moderate sedation services; each additional                            15 minutes intraservice time Diagnosis Code(s):         --- Professional ---                           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 All rights reserved. The codes documented in this report are preliminary and upon coder review may  be revised to meet current compliance requirements. Cristopher Estimable. Setareh Rom, MD Norvel Richards, MD 06/13/2015 2:25:03 PM This report has been signed electronically. Number of Addenda: 0

## 2015-06-13 NOTE — Discharge Instructions (Signed)
Colonoscopy Discharge Instructions  Read the instructions outlined below and refer to this sheet in the next few weeks. These discharge instructions provide you with general information on caring for yourself after you leave the hospital. Your doctor may also give you specific instructions. While your treatment has been planned according to the most current medical practices available, unavoidable complications occasionally occur. If you have any problems or questions after discharge, call Dr. Gala Romney at [phone]. ACTIVITY  You may resume your regular activity, but move at a slower pace for the next 24 hours.   Take frequent rest periods for the next 24 hours.   Walking will help get rid of the air and reduce the bloated feeling in your belly (abdomen).   No driving for 24 hours (because of the medicine (anesthesia) used during the test).    Do not sign any important legal documents or operate any machinery for 24 hours (because of the anesthesia used during the test).  NUTRITION  Drink plenty of fluids.   You may resume your normal diet as instructed by your doctor.   Begin with a light meal and progress to your normal diet. Heavy or fried foods are harder to digest and may make you feel sick to your stomach (nauseated).   Avoid alcoholic beverages for 24 hours or as instructed.  MEDICATIONS  You may resume your normal medications unless your doctor tells you otherwise.  WHAT YOU CAN EXPECT TODAY  Some feelings of bloating in the abdomen.   Passage of more gas than usual.   Spotting of blood in your stool or on the toilet paper.  IF YOU HAD POLYPS REMOVED DURING THE COLONOSCOPY:  No aspirin products for 7 days or as instructed.   No alcohol for 7 days or as instructed.   Eat a soft diet for the next 24 hours.  FINDING OUT THE RESULTS OF YOUR TEST Not all test results are available during your visit. If your test results are not back during the visit, make an appointment  with your caregiver to find out the results. Do not assume everything is normal if you have not heard from your caregiver or the medical facility. It is important for you to follow up on all of your test results.  SEEK IMMEDIATE MEDICAL ATTENTION IF:  You have more than a spotting of blood in your stool.   Your belly is swollen (abdominal distention).   You are nauseated or vomiting.   You have a temperature over 101.   You have abdominal pain or discomfort that is severe or gets worse throughout the day.   Constipation, hemorrhoids and colon polyp information provided  Trial of Benefiber 1 tablespoon twice daily  Provide pamphlet on hemorrhoid banding  Office visit with me in 4-6 weeks for hemorrhoid banding  Further recommendations to follow pending pathology report  Colon Polyps Polyps are lumps of extra tissue growing inside the body. Polyps can grow in the large intestine (colon). Most colon polyps are noncancerous (benign). However, some colon polyps can become cancerous over time. Polyps that are larger than a pea may be harmful. To be safe, caregivers remove and test all polyps. CAUSES  Polyps form when mutations in the genes cause your cells to grow and divide even though no more tissue is needed. RISK FACTORS There are a number of risk factors that can increase your chances of getting colon polyps. They include:  Being older than 50 years.  Family history of colon polyps or  colon cancer.  Long-term colon diseases, such as colitis or Crohn disease.  Being overweight.  Smoking.  Being inactive.  Drinking too much alcohol. SYMPTOMS  Most small polyps do not cause symptoms. If symptoms are present, they may include:  Blood in the stool. The stool may look dark red or black.  Constipation or diarrhea that lasts longer than 1 week. DIAGNOSIS People often do not know they have polyps until their caregiver finds them during a regular checkup. Your caregiver can use  4 tests to check for polyps:  Digital rectal exam. The caregiver wears gloves and feels inside the rectum. This test would find polyps only in the rectum.  Barium enema. The caregiver puts a liquid called barium into your rectum before taking X-rays of your colon. Barium makes your colon look white. Polyps are dark, so they are easy to see in the X-ray pictures.  Sigmoidoscopy. A thin, flexible tube (sigmoidoscope) is placed into your rectum. The sigmoidoscope has a light and tiny camera in it. The caregiver uses the sigmoidoscope to look at the last third of your colon.  Colonoscopy. This test is like sigmoidoscopy, but the caregiver looks at the entire colon. This is the most common method for finding and removing polyps. TREATMENT  Any polyps will be removed during a sigmoidoscopy or colonoscopy. The polyps are then tested for cancer. PREVENTION  To help lower your risk of getting more colon polyps:  Eat plenty of fruits and vegetables. Avoid eating fatty foods.  Do not smoke.  Avoid drinking alcohol.  Exercise every day.  Lose weight if recommended by your caregiver.  Eat plenty of calcium and folate. Foods that are rich in calcium include milk, cheese, and broccoli. Foods that are rich in folate include chickpeas, kidney beans, and spinach. HOME CARE INSTRUCTIONS Keep all follow-up appointments as directed by your caregiver. You may need periodic exams to check for polyps. SEEK MEDICAL CARE IF: You notice bleeding during a bowel movement.   This information is not intended to replace advice given to you by your health care provider. Make sure you discuss any questions you have with your health care provider.   Hemorrhoids Hemorrhoids are swollen veins around the rectum or anus. There are two types of hemorrhoids:  Internal hemorrhoids. These occur in the veins just inside the rectum. They may poke through to the outside and become irritated and painful. External hemorrhoids.  These occur in the veins outside the anus and can be felt as a painful swelling or hard lump near the anus. CAUSES Pregnancy.  Obesity.  Constipation or diarrhea.  Straining to have a bowel movement.  Sitting for long periods on the toilet. Heavy lifting or other activity that caused you to strain. Anal intercourse. SYMPTOMS  Pain.  Anal itching or irritation.  Rectal bleeding.  Fecal leakage.  Anal swelling.  One or more lumps around the anus.  DIAGNOSIS  Your caregiver may be able to diagnose hemorrhoids by visual examination. Other examinations or tests that may be performed include:  Examination of the rectal area with a gloved hand (digital rectal exam).  Examination of anal canal using a small tube (scope).  A blood test if you have lost a significant amount of blood. A test to look inside the colon (sigmoidoscopy or colonoscopy). TREATMENT Most hemorrhoids can be treated at home. However, if symptoms do not seem to be getting better or if you have a lot of rectal bleeding, your caregiver may perform a procedure to  help make the hemorrhoids get smaller or remove them completely. Possible treatments include:  Placing a rubber band at the base of the hemorrhoid to cut off the circulation (rubber band ligation).  Injecting a chemical to shrink the hemorrhoid (sclerotherapy).  Using a tool to burn the hemorrhoid (infrared light therapy).  Surgically removing the hemorrhoid (hemorrhoidectomy).  Stapling the hemorrhoid to block blood flow to the tissue (hemorrhoid stapling).  HOME CARE INSTRUCTIONS  Eat foods with fiber, such as whole grains, beans, nuts, fruits, and vegetables. Ask your doctor about taking products with added fiber in them (fibersupplements). Increase fluid intake. Drink enough water and fluids to keep your urine clear or pale yellow.  Exercise regularly.  Go to the bathroom when you have the urge to have a bowel movement. Do not wait.  Avoid  straining to have bowel movements.  Keep the anal area dry and clean. Use wet toilet paper or moist towelettes after a bowel movement.  Medicated creams and suppositories may be used or applied as directed.  Only take over-the-counter or prescription medicines as directed by your caregiver.  Take warm sitz baths for 15-20 minutes, 3-4 times a day to ease pain and discomfort.  Place ice packs on the hemorrhoids if they are tender and swollen. Using ice packs between sitz baths may be helpful.  Put ice in a plastic bag.  Place a towel between your skin and the bag.  Leave the ice on for 15-20 minutes, 3-4 times a day.  Do not use a donut-shaped pillow or sit on the toilet for long periods. This increases blood pooling and pain.  SEEK MEDICAL CARE IF: You have increasing pain and swelling that is not controlled by treatment or medicine. You have uncontrolled bleeding. You have difficulty or you are unable to have a bowel movement. You have pain or inflammation outside the area of the hemorrhoids. MAKE SURE YOU: Understand these instructions. Will watch your condition. Will get help right away if you are not doing well or get worse.   This information is not intended to replace advice given to you by your health care provider. Make sure you discuss any questions you have with your health care provider.   Constipation, Adult Constipation is when a person has fewer than three bowel movements a week, has difficulty having a bowel movement, or has stools that are dry, hard, or larger than normal. As people grow older, constipation is more common. A low-fiber diet, not taking in enough fluids, and taking certain medicines may make constipation worse.  CAUSES  Certain medicines, such as antidepressants, pain medicine, iron supplements, antacids, and water pills.  Certain diseases, such as diabetes, irritable bowel syndrome (IBS), thyroid disease, or depression.  Not drinking enough  water.  Not eating enough fiber-rich foods.  Stress or travel.  Lack of physical activity or exercise.  Ignoring the urge to have a bowel movement.  Using laxatives too much.  SIGNS AND SYMPTOMS  Having fewer than three bowel movements a week.  Straining to have a bowel movement.  Having stools that are hard, dry, or larger than normal.  Feeling full or bloated.  Pain in the lower abdomen.  Not feeling relief after having a bowel movement.  DIAGNOSIS  Your health care provider will take a medical history and perform a physical exam. Further testing may be done for severe constipation. Some tests may include: A barium enema X-ray to examine your rectum, colon, and, sometimes, your small intestine.  A  sigmoidoscopy to examine your lower colon.  A colonoscopy to examine your entire colon. TREATMENT  Treatment will depend on the severity of your constipation and what is causing it. Some dietary treatments include drinking more fluids and eating more fiber-rich foods. Lifestyle treatments may include regular exercise. If these diet and lifestyle recommendations do not help, your health care provider may recommend taking over-the-counter laxative medicines to help you have bowel movements. Prescription medicines may be prescribed if over-the-counter medicines do not work.  HOME CARE INSTRUCTIONS  Eat foods that have a lot of fiber, such as fruits, vegetables, whole grains, and beans. Limit foods high in fat and processed sugars, such as french fries, hamburgers, cookies, candies, and soda.  A fiber supplement may be added to your diet if you cannot get enough fiber from foods.  Drink enough fluids to keep your urine clear or pale yellow.  Exercise regularly or as directed by your health care provider.  Go to the restroom when you have the urge to go. Do not hold it.  Only take over-the-counter or prescription medicines as directed by your health care provider. Do not take  other medicines for constipation without talking to your health care provider first.  [place] IF:  You have bright red blood in your stool.  Your constipation lasts for more than 4 days or gets worse.  You have abdominal or rectal pain.  You have thin, pencil-like stools.  You have unexplained weight loss. MAKE SURE YOU:  Understand these instructions. Will watch your condition. Will get help right away if you are not doing well or get worse.   This information is not intended to replace advice given to you by your health care provider. Make sure you discuss any questions you have with your health care provider.

## 2015-06-13 NOTE — H&P (View-Only) (Signed)
Primary Care Physician:  Mickie Hillier, MD  Primary Gastroenterologist:  Garfield Cornea, MD   Chief Complaint  Patient presents with  . Hemorrhoids    HPI:  Danny Rivers is a 45 y.o. male here at the request of Dr. Mickie Hillier for further evaluation of rectal bleeding and hemorrhoids. Patient gives history of rectal bleeding this been intermittent for several years. Sees blood in the stool, on the tissue. Sometimes stains his underwear. Denies any rectal pain, constipation, diarrhea, abdominal pain, upper GI symptoms. No weight loss. No family history of colon cancer. Patient prefers Dr. Gala Romney, because he personally knows Dr. Oneida Alar. Recent digital rectal exam by PCP with "impressive external hemorrhoids".     Current Outpatient Prescriptions  Medication Sig Dispense Refill  . albuterol (PROVENTIL HFA;VENTOLIN HFA) 108 (90 Base) MCG/ACT inhaler Inhale 2 puffs into the lungs every 6 (six) hours as needed for wheezing or shortness of breath. 1 Inhaler 2  . amoxicillin-clavulanate (AUGMENTIN) 875-125 MG tablet Take 1 tablet by mouth 2 (two) times daily. 20 tablet 0  . polyethylene glycol-electrolytes (NULYTELY/GOLYTELY) 420 g solution Take 4,000 mLs by mouth once. 4000 mL 0   No current facility-administered medications for this visit.    Allergies as of 05/26/2015  . (No Known Allergies)    No past medical history on file.  Past Surgical History  Procedure Laterality Date  . Vastectomy      Family History  Problem Relation Age of Onset  . Colon cancer Neg Hx     Social History   Social History  . Marital Status: Divorced    Spouse Name: N/A  . Number of Children: 5  . Years of Education: N/A   Occupational History  . [employer]    Social History Main Topics  . Smoking status: Never Smoker   . Smokeless tobacco: Not on file  . Alcohol Use: No  . Drug Use: No  . Sexual Activity: Not on file   Other Topics Concern  . Not on file   Social History  Narrative      ROS:  General: Negative for anorexia, weight loss, fever, chills, fatigue, weakness. Eyes: Negative for vision changes.  ENT: Negative for hoarseness, difficulty swallowing , nasal congestion. CV: Negative for chest pain, angina, palpitations, dyspnea on exertion, peripheral edema.  Respiratory: Negative for dyspnea at rest, dyspnea on exertion, cough, sputum, wheezing.  GI: See history of present illness. GU:  Negative for dysuria, hematuria, urinary incontinence, urinary frequency, nocturnal urination.  MS: Negative for joint pain, low back pain.  Derm: Negative for rash or itching.  Neuro: Negative for weakness, abnormal sensation, seizure, frequent headaches, memory loss, confusion.  Psych: Negative for anxiety, depression, suicidal ideation, hallucinations.  Endo: Negative for unusual weight change.  Heme: Negative for bruising or bleeding. Allergy: Negative for rash or hives.    Physical Examination:  BP 162/89 mmHg  Pulse 69  Temp(Src) 97.3 F (36.3 C) (Oral)  Ht 6\' 2"  (1.88 m)  Wt 266 lb 3.2 oz (120.748 kg)  BMI 34.16 kg/m2   General: Well-nourished, well-developed in no acute distress.  Head: Normocephalic, atraumatic.   Eyes: Conjunctiva pink, no icterus. Mouth: Oropharyngeal mucosa moist and pink , no lesions erythema or exudate. Neck: Supple without thyromegaly, masses, or lymphadenopathy.  Lungs: Clear to auscultation bilaterally.  Heart: Regular rate and rhythm, no murmurs rubs or gallops.  Abdomen: Bowel sounds are normal, nontender, nondistended, no hepatosplenomegaly or masses, no abdominal bruits or    hernia ,  no rebound or guarding.   Rectal: Deferred Extremities: No lower extremity edema. No clubbing or deformities.  Neuro: Alert and oriented x 4 , grossly normal neurologically.  Skin: Warm and dry, no rash or jaundice.   Psych: Alert and cooperative, normal mood and affect.  Labs: No results found for: CREATININE, BUN, NA, K, CL,  CO2 No results found for: ALT, AST, GGT, ALKPHOS, BILITOT   Imaging Studies: No results found.

## 2015-06-16 ENCOUNTER — Encounter: Payer: Self-pay | Admitting: Internal Medicine

## 2015-06-17 ENCOUNTER — Encounter: Payer: Self-pay | Admitting: Internal Medicine

## 2015-06-17 ENCOUNTER — Telehealth: Payer: Self-pay

## 2015-06-17 NOTE — Telephone Encounter (Signed)
Letter and hemorrhoid banding info mailed to the pt.

## 2015-06-17 NOTE — Telephone Encounter (Signed)
Per RMR- Send letter to patient.  Send copy of letter with path to referring provider and PCP.   Send him a pamphlet on hemorrhoid banding.  He should have a follow-up appointment for banding in the next several weeks

## 2015-06-18 ENCOUNTER — Encounter (HOSPITAL_COMMUNITY): Payer: Self-pay | Admitting: Internal Medicine

## 2015-07-04 ENCOUNTER — Encounter: Payer: BLUE CROSS/BLUE SHIELD | Admitting: Internal Medicine

## 2015-07-07 ENCOUNTER — Other Ambulatory Visit: Payer: Self-pay | Admitting: Family Medicine

## 2015-07-07 DIAGNOSIS — Z139 Encounter for screening, unspecified: Secondary | ICD-10-CM | POA: Diagnosis not present

## 2015-07-07 LAB — HEPATIC FUNCTION PANEL
ALT: 23 U/L (ref 9–46)
AST: 12 U/L (ref 10–40)
Albumin: 3.9 g/dL (ref 3.6–5.1)
Alkaline Phosphatase: 72 U/L (ref 40–115)
Bilirubin, Direct: 0.1 mg/dL (ref <=0.2)
Indirect Bilirubin: 0.3 mg/dL (ref 0.2–1.2)
Total Bilirubin: 0.4 mg/dL (ref 0.2–1.2)
Total Protein: 6.8 g/dL (ref 6.1–8.1)

## 2015-07-07 LAB — LIPID PANEL
Cholesterol: 225 mg/dL — ABNORMAL HIGH (ref 125–200)
HDL: 33 mg/dL — ABNORMAL LOW (ref >=40)
LDL Cholesterol: 158 mg/dL — ABNORMAL HIGH (ref <130)
Total CHOL/HDL Ratio: 6.8 Ratio — ABNORMAL HIGH (ref <=5.0)
Triglycerides: 170 mg/dL — ABNORMAL HIGH (ref <150)
VLDL: 34 mg/dL — ABNORMAL HIGH (ref <30)

## 2015-07-07 LAB — BASIC METABOLIC PANEL
BUN: 13 mg/dL (ref 7–25)
CO2: 23 mmol/L (ref 20–31)
Calcium: 8.8 mg/dL (ref 8.6–10.3)
Chloride: 103 mmol/L (ref 98–110)
Creat: 1.05 mg/dL (ref 0.60–1.35)
Glucose, Bld: 86 mg/dL (ref 65–99)
Potassium: 4.2 mmol/L (ref 3.5–5.3)
Sodium: 139 mmol/L (ref 135–146)

## 2015-07-08 ENCOUNTER — Encounter: Payer: Self-pay | Admitting: Family Medicine

## 2015-07-08 ENCOUNTER — Ambulatory Visit (INDEPENDENT_AMBULATORY_CARE_PROVIDER_SITE_OTHER): Payer: BLUE CROSS/BLUE SHIELD | Admitting: Family Medicine

## 2015-07-08 VITALS — BP 130/82 | Ht 74.0 in | Wt 268.0 lb

## 2015-07-08 DIAGNOSIS — Z0000 Encounter for general adult medical examination without abnormal findings: Secondary | ICD-10-CM

## 2015-07-08 LAB — PSA: PSA: 0.65 ng/mL (ref <=4.00)

## 2015-07-08 NOTE — Patient Instructions (Signed)
Results for orders placed or performed in visit on [id number]  Basic metabolic panel  Result Value Ref Range   Sodium 139 135 - 146 mmol/L   Potassium 4.2 3.5 - 5.3 mmol/L   Chloride 103 98 - 110 mmol/L   CO2 23 20 - 31 mmol/L   Glucose, Bld 86 65 - 99 mg/dL   BUN 13 7 - 25 mg/dL   Creat 1.05 0.60 - 1.35 mg/dL   Calcium 8.8 8.6 - 10.3 mg/dL  Lipid panel  Result Value Ref Range   Cholesterol 225 (H) 125 - 200 mg/dL   Triglycerides 170 (H) <150 mg/dL   HDL 33 (L) >=40 mg/dL   Total CHOL/HDL Ratio 6.8 (H) <=5.0 Ratio   VLDL 34 (H) <30 mg/dL   LDL Cholesterol 158 (H) <130 mg/dL  Hepatic function panel  Result Value Ref Range   Total Bilirubin 0.4 0.2 - 1.2 mg/dL   Bilirubin, Direct 0.1 <=0.2 mg/dL   Indirect Bilirubin 0.3 0.2 - 1.2 mg/dL   Alkaline Phosphatase 72 40 - 115 U/L   AST 12 10 - 40 U/L   ALT 23 9 - 46 U/L   Total Protein 6.8 6.1 - 8.1 g/dL   Albumin 3.9 3.6 - 5.1 g/dL  PSA  Result Value Ref Range   PSA 0.65 <=4.00 ng/mL   High Cholesterol High cholesterol refers to having a high level of cholesterol in your blood. Cholesterol is a white, waxy, fat-like protein that your body needs in small amounts. Your liver makes all the cholesterol you need. Excess cholesterol comes from the food you eat. Cholesterol travels in your bloodstream through your blood vessels. If you have high cholesterol, deposits (plaque) may build up on the walls of your blood vessels. This makes the arteries narrower and stiffer. Plaque increases your risk of heart attack and stroke. Work with your health care provider to keep your cholesterol levels in a healthy range. RISK FACTORS Several things can make you more likely to have high cholesterol. These include:   Eating foods high in animal fat (saturated fat) or cholesterol.  Being overweight.  Not getting enough exercise.  Having a family history of high cholesterol. SIGNS AND SYMPTOMS High cholesterol does not cause symptoms. DIAGNOSIS    Your health care provider can do a blood test to check whether you have high cholesterol. If you are older than 20, your health care provider may check your cholesterol every 4-6 years. You may be checked more often if you already have high cholesterol or other risk factors for heart disease. The blood test for cholesterol measures the following:  Bad cholesterol (LDL cholesterol). This is the type of cholesterol that causes heart disease. This number should be less than 100.  Good cholesterol (HDL cholesterol). This type helps protect against heart disease. A healthy level of HDL cholesterol is 60 or higher.  Total cholesterol. This is the combined number of LDL cholesterol and HDL cholesterol. A healthy number is less than 200. TREATMENT  High cholesterol can be treated with diet changes, lifestyle changes, and medicine.   Diet changes may include eating more whole grains, fruits, vegetables, nuts, and fish. You may also have to cut back on red meat and foods with a lot of added sugar.  Lifestyle changes may include getting at least 40 minutes of aerobic exercise three times a week. Aerobic exercises include walking, biking, and swimming. Aerobic exercise along with a healthy diet can help you maintain a healthy weight. Lifestyle changes  may also include quitting smoking.  If diet and lifestyle changes are not enough to lower your cholesterol, your health care provider may prescribe a statin medicine. This medicine has been shown to lower cholesterol and also lower the risk of heart disease. HOME CARE INSTRUCTIONS  Only take over-the-counter or prescription medicines as directed by your health care provider.   Follow a healthy diet as directed by your health care provider. For instance:   Eat chicken (without skin), fish, veal, shellfish, ground [place] breast, and round or loin cuts of red meat.  Do not eat fried foods and fatty meats, such as hot dogs and salami.   Eat plenty of  fruits, such as apples.   Eat plenty of vegetables, such as broccoli, potatoes, and carrots.   Eat beans, peas, and lentils.   Eat grains, such as barley, rice, couscous, and bulgur wheat.   Eat pasta without cream sauces.   Use skim or nonfat milk and low-fat or nonfat yogurt and cheeses. Do not eat or drink whole milk, cream, ice cream, egg yolks, and hard cheeses.   Do not eat stick margarine or tub margarines that contain trans fats (also called partially hydrogenated oils).   Do not eat cakes, cookies, crackers, or other baked goods that contain trans fats.   Do not eat saturated tropical oils, such as coconut and palm oil.   Exercise as directed by your health care provider. Increase your activity level with activities such as gardening or walking.   Keep all follow-up appointments.  SEEK MEDICAL CARE IF:  You are struggling to maintain a healthy diet or weight.  You need help starting an exercise program.  You need help to stop smoking. SEEK IMMEDIATE MEDICAL CARE IF:  You have chest pain.  You have trouble breathing.   This information is not intended to replace advice given to you by your health care provider. Make sure you discuss any questions you have with your health care provider.   Document Released: 02/22/2005 Document Revised: 03/15/2014 Document Reviewed: 12/15/2012 Elsevier Interactive Patient Education [employer].

## 2015-07-08 NOTE — Progress Notes (Signed)
Subjective:    Patient ID: Danny Rivers, male    DOB: 1970/03/14, 45 y.o.   MRN: [id number]  HPI The patient comes in today for a wellness visit.  Exercise two mi per day, walks several days per wk,  A review of their health history was completed.  A review of medications was also completed.  Any needed refills; no  Eating habits: pretty good  Falls/  MVA accidents in past few months: none  Regular exercise: 2-3 times a week  Specialist pt sees on regular basis: none  Preventative health issues were discussed.   Additional concerns: seeing podiatrist may 11 for heel pain  Results for orders placed or performed in visit on [id number]  Basic metabolic panel  Result Value Ref Range   Sodium 139 135 - 146 mmol/L   Potassium 4.2 3.5 - 5.3 mmol/L   Chloride 103 98 - 110 mmol/L   CO2 23 20 - 31 mmol/L   Glucose, Bld 86 65 - 99 mg/dL   BUN 13 7 - 25 mg/dL   Creat 1.05 0.60 - 1.35 mg/dL   Calcium 8.8 8.6 - 10.3 mg/dL  Lipid panel  Result Value Ref Range   Cholesterol 225 (H) 125 - 200 mg/dL   Triglycerides 170 (H) <150 mg/dL   HDL 33 (L) >=40 mg/dL   Total CHOL/HDL Ratio 6.8 (H) <=5.0 Ratio   VLDL 34 (H) <30 mg/dL   LDL Cholesterol 158 (H) <130 mg/dL  Hepatic function panel  Result Value Ref Range   Total Bilirubin 0.4 0.2 - 1.2 mg/dL   Bilirubin, Direct 0.1 <=0.2 mg/dL   Indirect Bilirubin 0.3 0.2 - 1.2 mg/dL   Alkaline Phosphatase 72 40 - 115 U/L   AST 12 10 - 40 U/L   ALT 23 9 - 46 U/L   Total Protein 6.8 6.1 - 8.1 g/dL   Albumin 3.9 3.6 - 5.1 g/dL  PSA  Result Value Ref Range   PSA 0.65 <=4.00 ng/mL   No fam hx of pros c a or colon ca    No close stroke or heart disese in the family  Diet so so for fast foods  Review of Systems  Constitutional: Negative for fever, activity change and appetite change.  HENT: Negative for congestion and rhinorrhea.   Eyes: Negative for discharge.  Respiratory: Negative for cough and wheezing.   Cardiovascular:  Negative for chest pain.  Gastrointestinal: Negative for vomiting, abdominal pain and blood in stool.  Genitourinary: Negative for frequency and difficulty urinating.  Musculoskeletal: Negative for neck pain.  Skin: Negative for rash.  Allergic/Immunologic: Negative for environmental allergies and food allergies.  Neurological: Negative for weakness and headaches.  Psychiatric/Behavioral: Negative for agitation.  All other systems reviewed and are negative.      Objective:   Physical Exam  Constitutional: He appears well-developed and well-nourished.  HENT:  Head: Normocephalic and atraumatic.  Right Ear: External ear normal.  Left Ear: External ear normal.  Nose: Nose normal.  Mouth/Throat: Oropharynx is clear and moist.  Eyes: EOM are normal. Pupils are equal, round, and reactive to light.  Neck: Normal range of motion. Neck supple. No thyromegaly present.  Cardiovascular: Normal rate, regular rhythm and normal heart sounds.   No murmur heard. Pulmonary/Chest: Effort normal and breath sounds normal. No respiratory distress. He has no wheezes.  Abdominal: Soft. Bowel sounds are normal. He exhibits no distension and no mass. There is no tenderness.  Genitourinary: Penis normal.  Musculoskeletal: Normal range  of motion. He exhibits no edema.  Lymphadenopathy:    He has no cervical adenopathy.  Neurological: He is alert. He exhibits normal muscle tone.  Skin: Skin is warm and dry. No erythema.  Psychiatric: He has a normal mood and affect. His behavior is normal. Judgment normal.  Vitals reviewed.   Prostate within normal limits      Assessment & Plan:  Impression 1 wellness exam #2 hyperlipidemia. New diagnosis. HDL unfortunately quite low and LDL too high. Significant dietary indiscretions per patient discussed at length #3 recent GI bleeding with external hemorrhoids patient to be banded soon. Plan educational information given. Diet exercise discussed. Blood work in one  year. If lipid panel not substantially improved will consider medication rationale discussed.

## 2015-07-11 ENCOUNTER — Encounter: Payer: Self-pay | Admitting: Internal Medicine

## 2015-07-11 ENCOUNTER — Ambulatory Visit (INDEPENDENT_AMBULATORY_CARE_PROVIDER_SITE_OTHER): Payer: BLUE CROSS/BLUE SHIELD | Admitting: Internal Medicine

## 2015-07-11 VITALS — BP 145/86 | HR 78 | Temp 97.9°F | Ht 74.0 in | Wt 265.8 lb

## 2015-07-11 DIAGNOSIS — K648 Other hemorrhoids: Secondary | ICD-10-CM | POA: Diagnosis not present

## 2015-07-11 NOTE — Progress Notes (Signed)
[place] banding procedure note:  The patient presents with symptomatic grade 3 hemorrhoids;  unresponsive to maximal medical therapy, requesting rubber band ligation of his hemorrhoidal disease. All risks, benefits, and alternative forms of therapy were described and informed consent was obtained.  Recent colonoscopy results reviewed  In the left lateral decubitus position,DR E. Was performed using lubrication consisting of nitroglycerin 125% and 2% Xylocaine cream. Sphincter tone a little high. No mass; otherwise negative aside from a single easily reducible hemorrhoid.  Hemorrhoid appears to arise from the left lateral column. The decision was made to band the left lateral internal hemorrhoid; the Dana was used to perform band ligation without complication. Digital anorectal examination was then performed to assure proper positioning of the band;  Band found to be in excellent position. No pinching or pain., The patient was discharged home without pain or other issues. Dietary and behavioral recommendations were given and  along with follow-up instructions. The patient will return in 4 weeks for followup and possible additional banding as required.  No complications were encountered and the patient tolerated the procedure well.

## 2015-07-11 NOTE — Patient Instructions (Signed)
Avoid straining.  Benefiber 1 tablespoon twice daily  Limit toilet time to 5 minutes  Call with any interim problems  Schedule followup appointment in 4 weeks from now   

## 2015-08-08 ENCOUNTER — Encounter: Payer: BLUE CROSS/BLUE SHIELD | Admitting: Internal Medicine

## 2015-08-26 ENCOUNTER — Encounter: Payer: Self-pay | Admitting: Internal Medicine

## 2015-08-26 ENCOUNTER — Telehealth: Payer: Self-pay | Admitting: Internal Medicine

## 2015-08-26 ENCOUNTER — Encounter: Payer: BLUE CROSS/BLUE SHIELD | Admitting: Internal Medicine

## 2015-08-26 NOTE — Telephone Encounter (Signed)
PATIENT WAS A NO SHOW AND LETTER SENT  °

## 2015-11-21 ENCOUNTER — Ambulatory Visit (INDEPENDENT_AMBULATORY_CARE_PROVIDER_SITE_OTHER): Payer: BLUE CROSS/BLUE SHIELD | Admitting: Pediatrics

## 2015-11-21 DIAGNOSIS — Z23 Encounter for immunization: Secondary | ICD-10-CM

## 2015-11-21 NOTE — Progress Notes (Signed)
Here for flu shot only.  Danny Core, MD

## 2015-12-08 DIAGNOSIS — T63441A Toxic effect of venom of bees, accidental (unintentional), initial encounter: Secondary | ICD-10-CM | POA: Diagnosis not present

## 2016-06-15 DIAGNOSIS — M25561 Pain in right knee: Secondary | ICD-10-CM | POA: Diagnosis not present

## 2016-06-19 DIAGNOSIS — M25561 Pain in right knee: Secondary | ICD-10-CM | POA: Diagnosis not present

## 2016-06-21 DIAGNOSIS — M25561 Pain in right knee: Secondary | ICD-10-CM | POA: Diagnosis not present

## 2016-07-21 DIAGNOSIS — J0300 Acute streptococcal tonsillitis, unspecified: Secondary | ICD-10-CM | POA: Diagnosis not present

## 2016-09-02 DIAGNOSIS — M25561 Pain in right knee: Secondary | ICD-10-CM | POA: Diagnosis not present

## 2016-09-06 DIAGNOSIS — J029 Acute pharyngitis, unspecified: Secondary | ICD-10-CM | POA: Diagnosis not present

## 2017-05-06 ENCOUNTER — Encounter (HOSPITAL_COMMUNITY): Payer: Self-pay | Admitting: Emergency Medicine

## 2017-05-06 ENCOUNTER — Emergency Department (HOSPITAL_COMMUNITY)
Admission: EM | Admit: 2017-05-06 | Discharge: 2017-05-06 | Disposition: A | Discharge status: Home / Self Care | Payer: BLUE CROSS/BLUE SHIELD | Attending: Emergency Medicine | Admitting: Emergency Medicine

## 2017-05-06 DIAGNOSIS — Y929 Unspecified place or not applicable: Secondary | ICD-10-CM | POA: Diagnosis not present

## 2017-05-06 DIAGNOSIS — Y999 Unspecified external cause status: Secondary | ICD-10-CM | POA: Diagnosis not present

## 2017-05-06 DIAGNOSIS — W260XXA Contact with knife, initial encounter: Secondary | ICD-10-CM | POA: Diagnosis not present

## 2017-05-06 DIAGNOSIS — Y93H3 Activity, building and construction: Secondary | ICD-10-CM | POA: Insufficient documentation

## 2017-05-06 DIAGNOSIS — S61012A Laceration without foreign body of left thumb without damage to nail, initial encounter: Secondary | ICD-10-CM | POA: Diagnosis not present

## 2017-05-06 DIAGNOSIS — S61011A Laceration without foreign body of right thumb without damage to nail, initial encounter: Secondary | ICD-10-CM

## 2017-05-06 NOTE — ED Provider Notes (Signed)
[facility] EMERGENCY DEPARTMENT Provider Note   CSN: [id number] Arrival date & time: 05/06/17  1126     History   Chief Complaint Chief Complaint  Patient presents with  . Laceration    HPI Danny Rivers is a 47 y.o. male.  The history is provided by the patient. No language interpreter was used.  Laceration   The incident occurred yesterday. The laceration is located on the left hand. The laceration is 1 cm in size. The laceration mechanism was a a metal edge. The pain is mild. The pain has been constant since onset. He reports no foreign bodies present. His tetanus status is UTD.  Pt cut finger while cutting laminate flooring  Past Medical History:  Diagnosis Date  . Hemorrhoids     Patient Active Problem List   Diagnosis Date Noted  . History of colonic polyps   . Hemorrhoid   . Hemorrhoids 05/26/2015  . Rectal bleeding 05/26/2015    Past Surgical History:  Procedure Laterality Date  . COLONOSCOPY N/A 06/13/2015   Dr.Rourk- tortuous colon, grade III moderate to medium sized hemorrhoids. , 18mm polyp in the cecum. bx= benign colonic mucosa  . HEMORRHOID BANDING  2017   Dr.Rourk  . vastectomy         Home Medications    Prior to Admission medications   Medication Sig Start Date End Date Taking? Authorizing Provider  albuterol (PROVENTIL HFA;VENTOLIN HFA) 108 (90 Base) MCG/ACT inhaler Inhale 2 puffs into the lungs every 6 (six) hours as needed for wheezing or shortness of breath. Patient not taking: Reported on 07/11/2015 05/22/15   Mikey Kirschner, MD    Family History Family History  Problem Relation Age of Onset  . Colon cancer Neg Hx     Social History Social History   Tobacco Use  . Smoking status: Never Smoker  . Tobacco comment: never smoked  Substance Use Topics  . Alcohol use: No    Alcohol/week: 0.0 oz  . Drug use: No     Allergies   Patient has no known allergies.   Review of Systems Review of Systems  All other systems  reviewed and are negative.    Physical Exam Updated Vital Signs BP (!) 167/100 (BP Location: Right Arm)   Pulse 75   Temp 98.8 F (37.1 C) (Oral)   Resp 16   Ht 6\' 2"  (1.88 m)   Wt 111.1 kg (245 lb)   SpO2 99%   BMI 31.46 kg/m   Physical Exam  Constitutional: He is oriented to person, place, and time. He appears well-developed and well-nourished.  HENT:  Head: Normocephalic.  Neck: Normal range of motion.  Cardiovascular: Normal rate.  Pulmonary/Chest: Effort normal.  Abdominal: He exhibits no distension.  Musculoskeletal:  1cm superficial laceration lateral aspect of finger, nail preserved.  No gapping nv and ns intact  Neurological: He is alert and oriented to person, place, and time.  Psychiatric: He has a normal mood and affect.  Nursing note and vitals reviewed.    ED Treatments / Results  Labs (all labs ordered are listed, but only abnormal results are displayed) Labs Reviewed - No data to display  EKG  EKG Interpretation None       Radiology No results found.  Procedures Procedures (including critical care time)  Medications Ordered in ED Medications - No data to display   Initial Impression / Assessment and Plan / ED Course  I have reviewed the triage vital signs and the  nursing notes.  Pertinent labs & imaging results that were available during my care of the patient were reviewed by me and considered in my medical decision making (see chart for details).     MDM  Sutures not needed.  Wound cleaned and bandaged.  Final Clinical Impressions(s) / ED Diagnoses   Final diagnoses:  Laceration of right thumb without foreign body, nail damage status unspecified, initial encounter    ED Discharge Orders    None    An After Visit Summary was printed and given to the patient.   Fransico Meadow, [place] 05/06/17 1159    Daleen Bo, MD 05/06/17 1353

## 2017-05-06 NOTE — Discharge Instructions (Signed)
Return if any problems.

## 2017-05-06 NOTE — ED Triage Notes (Signed)
Pt states he was putting down laminate flooring yesterday and cut left thumb with a boxcutter around 10am.

## 2019-01-30 ENCOUNTER — Other Ambulatory Visit: Payer: Self-pay

## 2019-01-30 DIAGNOSIS — Z20822 Contact with and (suspected) exposure to covid-19: Secondary | ICD-10-CM

## 2019-01-31 LAB — NOVEL CORONAVIRUS, NAA: SARS-CoV-2, NAA: NOT DETECTED

## 2019-04-11 ENCOUNTER — Encounter: Payer: Self-pay | Admitting: Family Medicine

## 2019-05-08 ENCOUNTER — Ambulatory Visit: Payer: 59 | Attending: Internal Medicine

## 2019-05-08 DIAGNOSIS — Z23 Encounter for immunization: Secondary | ICD-10-CM | POA: Insufficient documentation

## 2019-05-08 NOTE — Progress Notes (Signed)
   Covid-19 Vaccination Clinic  Name:  Danny Rivers    MRN: [id number] DOB: 1970-04-06  05/08/2019  [name] was observed post Covid-19 immunization for 15 minutes without incident. He was provided with Vaccine Information Sheet and instruction to access the V-Safe system.   [name] was instructed to call 911 with any severe reactions post vaccine: [name] Kitchen Difficulty breathing  . Swelling of face and throat  . A fast heartbeat  . A bad rash all over body  . Dizziness and weakness   Immunizations Administered    Name Date Dose VIS Date Route   Moderna COVID-19 Vaccine 05/08/2019 11:55 AM 0.5 mL 02/06/2019 Intramuscular   Manufacturer: Moderna   Lot: [id number]   [id number]

## 2019-06-01 ENCOUNTER — Encounter: Payer: Self-pay | Admitting: Family Medicine

## 2019-06-01 ENCOUNTER — Ambulatory Visit (INDEPENDENT_AMBULATORY_CARE_PROVIDER_SITE_OTHER): Payer: 59 | Admitting: Family Medicine

## 2019-06-01 ENCOUNTER — Other Ambulatory Visit: Payer: Self-pay

## 2019-06-01 DIAGNOSIS — E669 Obesity, unspecified: Secondary | ICD-10-CM | POA: Insufficient documentation

## 2019-06-01 DIAGNOSIS — R5383 Other fatigue: Secondary | ICD-10-CM | POA: Diagnosis not present

## 2019-06-01 DIAGNOSIS — E782 Mixed hyperlipidemia: Secondary | ICD-10-CM

## 2019-06-01 DIAGNOSIS — I10 Essential (primary) hypertension: Secondary | ICD-10-CM

## 2019-06-01 MED ORDER — LISINOPRIL 10 MG PO TABS: 10.0000 mg | ORAL_TABLET | Freq: Every day | ORAL | 1 refills | Status: DC

## 2019-06-01 NOTE — Assessment & Plan Note (Signed)
Deteriorated, Danny Rivers is educated about the importance of exercise daily to help with weight management. A minumum of 30 minutes daily is recommended. Additionally, importance of healthy food choices  with portion control discussed. Blood pressures will be elevated once we get blood pressure under control.  Would consider using phentermine.  Strongly encouraged him to continue lifestyle and diet changes.  As those are the most beneficial for weight loss. Patient acknowledged agreement and understanding of the plan.    Wt Readings from Last 3 Encounters:  06/01/19 278 lb 1.9 oz (126.2 kg)  05/06/17 245 lb (111.1 kg)  07/11/15 265 lb 12.8 oz (120.6 kg)

## 2019-06-01 NOTE — Assessment & Plan Note (Signed)
Danny Rivers is encouraged to maintain a well balanced diet that is low in salt. Not Controlled-we will be starting 10 mg lisinopril.  Reviewed side effects, risks and benefits of medication.  Close follow-up in a few weeks.  If blood pressure can get under control will consider using phentermine to help with weight loss.  Additionally, he is also reminded that exercise is beneficial for heart health and control of  Blood pressure. 30-60 minutes daily is recommended-walking was suggested.

## 2019-06-01 NOTE — Patient Instructions (Signed)
I appreciate the opportunity to provide you with care for your health and wellness. Today we discussed: establish care   Follow up: 2-3 weeks for BP   Labs today  Start taking the Lisinopril daily   Please continue to practice social distancing to keep you, your family, and our community safe.  If you must go out, please wear a mask and practice good handwashing.  It was a pleasure to see you and I look forward to continuing to work together on your health and well-being. Please do not hesitate to call the office if you need care or have questions about your care.  Have a wonderful day and week. With Gratitude, Cherly Beach, DNP, AGNP-BC

## 2019-06-01 NOTE — Progress Notes (Signed)
Subjective:  Patient ID: Danny Rivers, male    DOB: 1970/09/21  Age: 49 y.o. MRN: [id number]  CC:  Chief Complaint  Patient presents with  . New Patient (Initial Visit)    no concerns just would like to discuss weight loss       HPI  HPI   Danny Rivers is a 49 year old African-American male who presents today to establish care as Dr. Wolfgang Phoenix is retiring.  He reports he has no major concerns outside of would like to discuss weight loss options. He has had trouble maintaining weight loss for the last couple years.  Reports that he used to be around 225 but is now staying elevated in the 160s 170s.  Reports using a stationary bike at home 3 days a week for at least 3 to 5 miles at a time.  Uses the palatine app.  Uses [employer] 2 days a week for treadmill work, elliptical, some weight training. Diet wise he does not eat much breakfast secondary to working second shift.  Usually eats around 1:59 PM and then again around 10 PM prior to getting off work a couple hours later.  He does not get home until about 230 or so in the morning.  Sleeps for couple hours and gets up to help the kids get ready for home schooling.  He tries to avoid fast food and does not eat any pork products.  He reports eating a lot of salads especially here more recently and trying to drink more water recently.  When asked he thinks that fried foods may be his biggest issue as he has them several times a week.  And also eats a lot of breads and pastas.  He had has tried some supplements from [employer] without much success.  Is open to trying to use a supplement and work on lifestyle and diet changes.  Is up-to-date on all health maintenance items.  Received his first shot of Covid and will get a second shot on March 30.  Has limited history has had a colonoscopy which was clear.  Has hemorrhoids.  Reports having prehypertension but not on anything.  Has had high cholesterol in the past but is not taking anything for  it.  Is open to having updated labs today has not had anything since about 10:50 PM last night.  Today patient denies signs and symptoms of COVID 19 infection including fever, chills, cough, shortness of breath, and headache. Past Medical, Surgical, Social History, Allergies, and Medications have been Reviewed.   Past Medical History:  Diagnosis Date  . Hemorrhoids   . Hypertension     No outpatient medications have been marked as taking for the 06/01/19 encounter (Office Visit) with Perlie Mayo, NP.    ROS:  Review of Systems  Constitutional: Negative.   HENT: Negative.   Eyes: Negative.   Respiratory: Negative.   Cardiovascular: Negative.   Gastrointestinal: Negative.   Genitourinary: Negative.   Musculoskeletal: Negative.   Skin: Negative.   Neurological: Negative.   Endo/Heme/Allergies: Negative.   Psychiatric/Behavioral: Negative.   All other systems reviewed and are negative.    Objective:   Today's Vitals: BP 135/87 (BP Location: Right Arm, Patient Position: Sitting, Cuff Size: Large)   Pulse 66   Temp (!) 97.3 F (36.3 C) (Temporal)   Ht 6\' 2"  (1.88 m)   Wt 278 lb 1.9 oz (126.2 kg)   SpO2 99%   BMI 35.71 kg/m  Vitals with BMI  06/01/2019 05/06/2017 07/11/2015  Height 6\' 2"  6\' 2"  6\' 2"   Weight 278 lbs 2 oz 245 lbs 265 lbs 13 oz  BMI 35.69 [id number]  Systolic A999333 A999333 [id number]  Diastolic 87 [id number]  Pulse 66 75 78     Physical Exam Vitals and nursing note reviewed.  Constitutional:      Appearance: Normal appearance. He is well-developed and well-groomed. He is obese.  HENT:     Head: Normocephalic and atraumatic.     Right Ear: External ear normal.     Left Ear: External ear normal.     Mouth/Throat:     Comments: Mask in place Eyes:     General:        Right eye: No discharge.        Left eye: No discharge.     Conjunctiva/sclera: Conjunctivae normal.  Cardiovascular:     Rate and Rhythm: Normal rate and regular rhythm.     Pulses: Normal pulses.       Heart sounds: Normal heart sounds.  Pulmonary:     Effort: Pulmonary effort is normal.     Breath sounds: Normal breath sounds.  Musculoskeletal:        General: Normal range of motion.     Cervical back: Normal range of motion and neck supple.  Skin:    General: Skin is warm.  Neurological:     General: No focal deficit present.     Mental Status: He is alert and oriented to person, place, and time.  Psychiatric:        Attention and Perception: Attention normal.        Mood and Affect: Mood normal.        Speech: Speech normal.        Behavior: Behavior normal. Behavior is cooperative.        Thought Content: Thought content normal.        Cognition and Memory: Cognition normal.        Judgment: Judgment normal.     Assessment   1. Morbid obesity ([place])   2. Essential hypertension   3. Fatigue, unspecified type   4. Mixed hyperlipidemia      Tests ordered Orders Placed This Encounter  Procedures  . CBC  . COMPLETE METABOLIC PANEL WITH GFR  . Hemoglobin A1c  . Lipid panel  . VITAMIN D 25 Hydroxy (Vit-D Deficiency, Fractures)    Plan: Please see assessment and plan per problem list above.   Meds ordered this encounter  Medications  . lisinopril (ZESTRIL) 10 MG tablet    Sig: Take 1 tablet (10 mg total) by mouth daily.    Dispense:  30 tablet    Refill:  1    Order Specific Question:   Supervising Provider    Answer:   Jacklynn Bue    Patient to follow-up in 06/22/2019   Perlie Mayo, NP

## 2019-06-01 NOTE — Assessment & Plan Note (Signed)
Has not been on anything did have elevation in labs back in 2017.  Advised that he might need to be on something if these labs come back elevated again.  Encourage low-fat heart healthy diet.  In addition to exercise.

## 2019-06-02 LAB — COMPLETE METABOLIC PANEL WITH GFR
AG Ratio: 1.7 (calc) (ref 1.0–2.5)
ALT: 25 U/L (ref 9–46)
AST: 15 U/L (ref 10–40)
Albumin: 4.3 g/dL (ref 3.6–5.1)
Alkaline phosphatase (APISO): 83 U/L (ref 36–130)
BUN: 9 mg/dL (ref 7–25)
CO2: 27 mmol/L (ref 20–32)
Calcium: 9.3 mg/dL (ref 8.6–10.3)
Chloride: 108 mmol/L (ref 98–110)
Creat: 1.1 mg/dL (ref 0.60–1.35)
GFR, Est African American: 92 mL/min/{1.73_m2} (ref >=60)
GFR, Est Non African American: 80 mL/min/{1.73_m2} (ref >=60)
Globulin: 2.5 g/dL (calc) (ref 1.9–3.7)
Glucose, Bld: 94 mg/dL (ref 65–99)
Potassium: 4.6 mmol/L (ref 3.5–5.3)
Sodium: 142 mmol/L (ref 135–146)
Total Bilirubin: 0.4 mg/dL (ref 0.2–1.2)
Total Protein: 6.8 g/dL (ref 6.1–8.1)

## 2019-06-02 LAB — CBC
HCT: 44.2 % (ref 38.5–50.0)
Hemoglobin: 14.7 g/dL (ref 13.2–17.1)
MCH: 28.7 pg (ref 27.0–33.0)
MCHC: 33.3 g/dL (ref 32.0–36.0)
MCV: 86.3 fL (ref 80.0–100.0)
MPV: 10.6 fL (ref 7.5–12.5)
Platelets: 274 10*3/uL (ref 140–400)
RBC: 5.12 10*6/uL (ref 4.20–5.80)
RDW: 12.6 % (ref 11.0–15.0)
WBC: 6.3 10*3/uL (ref 3.8–10.8)

## 2019-06-02 LAB — LIPID PANEL
Cholesterol: 222 mg/dL — ABNORMAL HIGH (ref <200)
HDL: 36 mg/dL — ABNORMAL LOW (ref >=40)
LDL Cholesterol (Calc): 159 mg/dL (calc) — ABNORMAL HIGH
Non-HDL Cholesterol (Calc): 186 mg/dL (calc) — ABNORMAL HIGH (ref <130)
Total CHOL/HDL Ratio: 6.2 (calc) — ABNORMAL HIGH (ref <5.0)
Triglycerides: 143 mg/dL (ref <150)

## 2019-06-02 LAB — HEMOGLOBIN A1C
Hgb A1c MFr Bld: 5.5 % of total Hgb (ref <5.7)
Mean Plasma Glucose: 111 (calc)
eAG (mmol/L): 6.2 (calc)

## 2019-06-02 LAB — VITAMIN D 25 HYDROXY (VIT D DEFICIENCY, FRACTURES): Vit D, 25-Hydroxy: 9 ng/mL — ABNORMAL LOW (ref 30–100)

## 2019-06-05 ENCOUNTER — Ambulatory Visit: Payer: 59 | Attending: Internal Medicine

## 2019-06-05 DIAGNOSIS — Z23 Encounter for immunization: Secondary | ICD-10-CM

## 2019-06-05 NOTE — Progress Notes (Signed)
   Covid-19 Vaccination Clinic  Name:  Danny Rivers    MRN: [id number] DOB: 1970-12-31  06/05/2019  Danny Rivers was observed post Covid-19 immunization for 15 minutes without incident. He was provided with Vaccine Information Sheet and instruction to access the V-Safe system.   Danny Rivers was instructed to call 911 with any severe reactions post vaccine: [name] Kitchen Difficulty breathing  . Swelling of face and throat  . A fast heartbeat  . A bad rash all over body  . Dizziness and weakness   Immunizations Administered    Name Date Dose VIS Date Route   Moderna COVID-19 Vaccine 06/05/2019 12:09 PM 0.5 mL 02/06/2019 Intramuscular   Manufacturer: Moderna   Lot: [id number]   [id number]

## 2019-06-13 ENCOUNTER — Other Ambulatory Visit: Payer: Self-pay | Admitting: *Deleted

## 2019-06-13 DIAGNOSIS — E782 Mixed hyperlipidemia: Secondary | ICD-10-CM

## 2019-06-13 DIAGNOSIS — I10 Essential (primary) hypertension: Secondary | ICD-10-CM

## 2019-06-13 DIAGNOSIS — E559 Vitamin D deficiency, unspecified: Secondary | ICD-10-CM

## 2019-06-22 ENCOUNTER — Other Ambulatory Visit: Payer: Self-pay

## 2019-06-22 ENCOUNTER — Ambulatory Visit (INDEPENDENT_AMBULATORY_CARE_PROVIDER_SITE_OTHER): Payer: 59 | Admitting: Family Medicine

## 2019-06-22 ENCOUNTER — Encounter: Payer: Self-pay | Admitting: Family Medicine

## 2019-06-22 VITALS — BP 141/84 | HR 81 | Ht 74.0 in | Wt 275.0 lb

## 2019-06-22 DIAGNOSIS — E559 Vitamin D deficiency, unspecified: Secondary | ICD-10-CM | POA: Diagnosis not present

## 2019-06-22 DIAGNOSIS — I10 Essential (primary) hypertension: Secondary | ICD-10-CM

## 2019-06-22 DIAGNOSIS — E782 Mixed hyperlipidemia: Secondary | ICD-10-CM | POA: Diagnosis not present

## 2019-06-22 MED ORDER — ROSUVASTATIN CALCIUM 5 MG PO TABS: 5.0000 mg | ORAL_TABLET | Freq: Every day | ORAL | 1 refills | Status: DC

## 2019-06-22 MED ORDER — LISINOPRIL 20 MG PO TABS: 20.0000 mg | ORAL_TABLET | Freq: Every day | ORAL | 2 refills | Status: DC

## 2019-06-22 MED ORDER — VITAMIN D (ERGOCALCIFEROL) 1.25 MG (50000 UNIT) PO CAPS: 50000.0000 [IU] | ORAL_CAPSULE | ORAL | 3 refills | Status: DC

## 2019-06-22 NOTE — Patient Instructions (Addendum)
I appreciate the opportunity to provide you with care for your health and wellness. Today we discussed: blood pressure and cholesterol  Follow up: as scheduled  No labs or referrals today  [place] :) Good Luck to your team !  Please continue to practice social distancing to keep you, your family, and our community safe.  If you must go out, please wear a mask and practice good handwashing.  It was a pleasure to see you and I look forward to continuing to work together on your health and well-being. Please do not hesitate to call the office if you need care or have questions about your care.  Have a wonderful day and week. With Gratitude, Cherly Beach, DNP, AGNP-BC

## 2019-06-22 NOTE — Assessment & Plan Note (Signed)
Starting Vitamin D prescription strength for 12 weeks  Reviewed side effects, risks and benefits of medication.

## 2019-06-22 NOTE — Progress Notes (Signed)
Subjective:  Patient ID: Danny Rivers, male    DOB: Jan 21, 1971  Age: 49 y.o. MRN: [id number]  CC:  Chief Complaint  Patient presents with  . Follow-up    2- week f/u on bp      HPI  HPI [name] is a 49 year old male patient who presents back today for 2-week follow-up on his blood pressure after starting lisinopril. Additionally review of labs done as he does need to have cholesterol medication and vitamin D started.  He is in agreements with both of these and willing to start both.  He denies having any headaches, vision changes, dizziness, leg swelling, chest pain or any other signs and symptoms of uncontrolled BP.  Reports he does notice much of a difference.  Reports he is not taking his blood pressure at home. Currently not doing any form of structured exercise.  Trying to work on diet and increasing water.  Today patient denies signs and symptoms of COVID 19 infection including fever, chills, cough, shortness of breath, and headache. Past Medical, Surgical, Social History, Allergies, and Medications have been Reviewed.   Past Medical History:  Diagnosis Date  . Hemorrhoids   . History of colonic polyps   . Hypertension   . Rectal bleeding 05/26/2015    No outpatient medications have been marked as taking for the 06/22/19 encounter (Office Visit) with Perlie Mayo, NP.    ROS:  Review of Systems  Constitutional: Negative.   HENT: Negative.   Eyes: Negative.   Respiratory: Negative.   Cardiovascular: Negative.   Gastrointestinal: Negative.   Genitourinary: Negative.   Musculoskeletal: Negative.   Skin: Negative.   Neurological: Negative.   Endo/Heme/Allergies: Negative.   Psychiatric/Behavioral: Negative.   All other systems reviewed and are negative.    Objective:   Today's Vitals: BP (!) 141/81 (BP Location: Right Arm, Patient Position: Sitting, Cuff Size: Large)   Pulse 81   Ht 6\' 2"  (1.88 m)   Wt 275 lb (124.7 kg)   SpO2 97%   BMI 35.31  kg/m  Vitals with BMI 06/22/2019 06/01/2019 05/06/2017  Height 6\' 2"  6\' 2"  6\' 2"   Weight 275 lbs 278 lbs 2 oz 245 lbs  BMI 35.29 0000000 [id number]  Systolic [id number] [id number]  Diastolic 81 87 [id number]  Pulse 81 66 75     Physical Exam Vitals and nursing note reviewed.  Constitutional:      Appearance: Normal appearance. He is well-developed and well-groomed. He is obese.  HENT:     Head: Normocephalic and atraumatic.     Right Ear: External ear normal.     Left Ear: External ear normal.     Mouth/Throat:     Comments: Mask in place  Eyes:     General:        Right eye: No discharge.        Left eye: No discharge.     Conjunctiva/sclera: Conjunctivae normal.  Cardiovascular:     Rate and Rhythm: Normal rate and regular rhythm.     Pulses: Normal pulses.     Heart sounds: Normal heart sounds.  Pulmonary:     Effort: Pulmonary effort is normal.     Breath sounds: Normal breath sounds.  Musculoskeletal:        General: Normal range of motion.     Cervical back: Normal range of motion and neck supple.  Skin:    General: Skin is warm.  Neurological:     General: No  focal deficit present.     Mental Status: He is alert and oriented to person, place, and time.  Psychiatric:        Attention and Perception: Attention normal.        Mood and Affect: Mood normal.        Speech: Speech normal.        Behavior: Behavior normal. Behavior is cooperative.        Thought Content: Thought content normal.        Cognition and Memory: Cognition normal.        Judgment: Judgment normal.     Assessment   1. Mixed hyperlipidemia   2. Essential hypertension   3. Vitamin D deficiency     Tests ordered No orders of the defined types were placed in this encounter.    Plan: Please see assessment and plan per problem list above.   Meds ordered this encounter  Medications  . rosuvastatin (CRESTOR) 5 MG tablet    Sig: Take 1 tablet (5 mg total) by mouth daily.    Dispense:  30 tablet    Refill:   1    Order Specific Question:   Supervising Provider    Answer:   SIMPSON, MARGARET E [id number]  . Vitamin D, Ergocalciferol, (DRISDOL) 1.25 MG (50000 UNIT) CAPS capsule    Sig: Take 1 capsule (50,000 Units total) by mouth every 7 (seven) days.    Dispense:  4 capsule    Refill:  3    Order Specific Question:   Supervising Provider    Answer:   SIMPSON, MARGARET E [id number]  . lisinopril (ZESTRIL) 20 MG tablet    Sig: Take 1 tablet (20 mg total) by mouth daily.    Dispense:  30 tablet    Refill:  2    Order Specific Question:   Supervising Provider    Answer:   Fayrene Helper [id number]    Patient to follow-up in as scheduled   Perlie Mayo, NP

## 2019-06-22 NOTE — Assessment & Plan Note (Signed)
Danny Rivers is encouraged to maintain a well balanced diet that is low in salt. Improved, but could see tighter control, increase lisinopril to 20mg   Additionally, he is also reminded that exercise is beneficial for heart health and control of  Blood pressure. 30-60 minutes daily is recommended-walking was suggested.   Reviewed side effects, risks and benefits of medication.

## 2019-06-22 NOTE — Assessment & Plan Note (Addendum)
Starting low dose Crestor and encouraged low fat diet and exercise.  Reviewed side effects, risks and benefits of medication.

## 2019-09-14 ENCOUNTER — Telehealth: Payer: 59 | Admitting: Family Medicine

## 2019-09-15 LAB — COMPLETE METABOLIC PANEL WITH GFR
AG Ratio: 1.6 (calc) (ref 1.0–2.5)
ALT: 32 U/L (ref 9–46)
AST: 19 U/L (ref 10–40)
Albumin: 4.2 g/dL (ref 3.6–5.1)
Alkaline phosphatase (APISO): 81 U/L (ref 36–130)
BUN: 12 mg/dL (ref 7–25)
CO2: 25 mmol/L (ref 20–32)
Calcium: 9.1 mg/dL (ref 8.6–10.3)
Chloride: 106 mmol/L (ref 98–110)
Creat: 1.14 mg/dL (ref 0.60–1.35)
GFR, Est African American: 88 mL/min/{1.73_m2} (ref >=60)
GFR, Est Non African American: 76 mL/min/{1.73_m2} (ref >=60)
Globulin: 2.6 g/dL (calc) (ref 1.9–3.7)
Glucose, Bld: 94 mg/dL (ref 65–99)
Potassium: 3.9 mmol/L (ref 3.5–5.3)
Sodium: 139 mmol/L (ref 135–146)
Total Bilirubin: 0.2 mg/dL (ref 0.2–1.2)
Total Protein: 6.8 g/dL (ref 6.1–8.1)

## 2019-09-15 LAB — HEMOGLOBIN A1C
Hgb A1c MFr Bld: 5.5 % of total Hgb (ref <5.7)
Mean Plasma Glucose: 111 (calc)
eAG (mmol/L): 6.2 (calc)

## 2019-09-15 LAB — CBC
HCT: 44.3 % (ref 38.5–50.0)
Hemoglobin: 14.6 g/dL (ref 13.2–17.1)
MCH: 28.2 pg (ref 27.0–33.0)
MCHC: 33 g/dL (ref 32.0–36.0)
MCV: 85.7 fL (ref 80.0–100.0)
MPV: 10.5 fL (ref 7.5–12.5)
Platelets: 284 10*3/uL (ref 140–400)
RBC: 5.17 10*6/uL (ref 4.20–5.80)
RDW: 12.7 % (ref 11.0–15.0)
WBC: 7.4 10*3/uL (ref 3.8–10.8)

## 2019-09-15 LAB — LIPID PANEL
Cholesterol: 159 mg/dL (ref <200)
HDL: 34 mg/dL — ABNORMAL LOW (ref >=40)
LDL Cholesterol (Calc): 102 mg/dL (calc) — ABNORMAL HIGH
Non-HDL Cholesterol (Calc): 125 mg/dL (calc) (ref <130)
Total CHOL/HDL Ratio: 4.7 (calc) (ref <5.0)
Triglycerides: 136 mg/dL (ref <150)

## 2019-09-15 LAB — VITAMIN D 25 HYDROXY (VIT D DEFICIENCY, FRACTURES): Vit D, 25-Hydroxy: 41 ng/mL (ref 30–100)

## 2019-09-19 ENCOUNTER — Other Ambulatory Visit: Payer: Self-pay

## 2019-09-19 ENCOUNTER — Encounter: Payer: Self-pay | Admitting: Family Medicine

## 2019-09-19 ENCOUNTER — Telehealth (INDEPENDENT_AMBULATORY_CARE_PROVIDER_SITE_OTHER): Payer: 59 | Admitting: Family Medicine

## 2019-09-19 DIAGNOSIS — E782 Mixed hyperlipidemia: Secondary | ICD-10-CM | POA: Diagnosis not present

## 2019-09-19 DIAGNOSIS — I10 Essential (primary) hypertension: Secondary | ICD-10-CM

## 2019-09-19 DIAGNOSIS — E559 Vitamin D deficiency, unspecified: Secondary | ICD-10-CM

## 2019-09-19 NOTE — Patient Instructions (Signed)
I appreciate the opportunity to provide you with care for your health and wellness. Today we discussed: overall health and recent labs  Follow up: March for annual (fasting)  No labs or referrals today  GREAT JOB ON DIET AND LIFESTYLE CHANGES! KEEP GOING!  Please continue to practice social distancing to keep you, your family, and our community safe.  If you must go out, please wear a mask and practice good handwashing.  It was a pleasure to see you and I look forward to continuing to work together on your health and well-being. Please do not hesitate to call the office if you need care or have questions about your care.  Have a wonderful day and week. With Gratitude, Cherly Beach, DNP, AGNP-BC

## 2019-09-19 NOTE — Assessment & Plan Note (Signed)
Improved, encouraged OTC Vit D 3 1,000 IU daily to maintain.

## 2019-09-19 NOTE — Assessment & Plan Note (Signed)
Danny Rivers is encouraged to maintain a well balanced diet that is low in salt. Higher range of controlled, continue current medication regimen.  Additionally, he is also reminded that exercise is beneficial for heart health and control of  Blood pressure. 30-60 minutes daily is recommended-walking was suggested.

## 2019-09-19 NOTE — Assessment & Plan Note (Signed)
Continue Crestor at this time.  Updated labs are very good.  Encouraged heart healthy diet and exercise

## 2019-09-19 NOTE — Assessment & Plan Note (Addendum)
Obesity is linked to HTN and HLD Improved  Encouraged to diet and lifestyle changes   Wt Readings from Last 3 Encounters:  09/19/19 275 lb (124.7 kg)  06/22/19 275 lb (124.7 kg)  06/01/19 278 lb 1.9 oz (126.2 kg)

## 2019-09-19 NOTE — Progress Notes (Addendum)
Virtual Visit via Telephone Note   This visit type was conducted due to national recommendations for restrictions regarding the COVID-19 Pandemic (e.g. social distancing) in an effort to limit this patient's exposure and mitigate transmission in our community.  Due to his co-morbid illnesses, this patient is at least at moderate risk for complications without adequate follow up.  This format is felt to be most appropriate for this patient at this time.  The patient did not have access to video technology/had technical difficulties with video requiring transitioning to audio format only (telephone).  All issues noted in this document were discussed and addressed.  No physical exam could be performed with this format.   Evaluation Performed:  Follow-up visit  Date:  09/19/2019   ID:  Danny Rivers, Danny Rivers 07-Mar-1971, MRN [id number]  Patient Location: Home Provider Location: Office/Clinic  Location of Patient: Home Location of Provider: Telehealth Consent was obtain for visit to be over via telehealth. I verified that I am speaking with the correct person using two identifiers.  PCP:  Perlie Mayo, NP   Chief Complaint:  3 month follow up   History of Present Illness:    Danny Rivers is a 49 y.o. male with history of hypertension, hyperlipidemia, vitamin D deficiency, morbid obesity.  Presents today for follow-up over the phone.  Had labs checked on Friday.  Overall labs have greatly improved detailed had labs checked on Friday.  Overall labs have greatly improved detailed information provided with the labs and what to continue over the time.  Denies having any issues or concerns to discuss today. Denies having any chest pain, palpitations, leg swelling, headaches, fevers, chills, cough, shortness of breath, dizziness or vision changes.  Denies having any muscle aches tolerating Crestor well.  The patient does not have symptoms concerning for COVID-19 infection (fever, chills,  cough, or new shortness of breath).   Past Medical, Surgical, Social History, Allergies, and Medications have been Reviewed.  Past Medical History:  Diagnosis Date  . Hemorrhoids   . History of colonic polyps   . Hypertension   . Rectal bleeding 05/26/2015   Past Surgical History:  Procedure Laterality Date  . COLONOSCOPY N/A 06/13/2015   Dr.Rourk- tortuous colon, grade III moderate to medium sized hemorrhoids. , 71mm polyp in the cecum. bx= benign colonic mucosa  . HEMORRHOID BANDING  2017   Dr.Rourk  . VASECTOMY    . vastectomy       Current Meds  Medication Sig  . lisinopril (ZESTRIL) 20 MG tablet Take 1 tablet (20 mg total) by mouth daily.  . rosuvastatin (CRESTOR) 5 MG tablet Take 1 tablet (5 mg total) by mouth daily.  . Vitamin D, Ergocalciferol, (DRISDOL) 1.25 MG (50000 UNIT) CAPS capsule Take 1 capsule (50,000 Units total) by mouth every 7 (seven) days.     Allergies:   Patient has no known allergies.   ROS:   Please see the history of present illness.    All other systems reviewed and are negative.   Labs/Other Tests and Data Reviewed:    Recent Labs: 09/14/2019: ALT 32; BUN 12; Creat 1.14; Hemoglobin 14.6; Platelets 284; Potassium 3.9; Sodium 139   Recent Lipid Panel Lab Results  Component Value Date/Time   CHOL 159 09/14/2019 08:42 AM   TRIG 136 09/14/2019 08:42 AM   HDL 34 (L) 09/14/2019 08:42 AM   CHOLHDL 4.7 09/14/2019 08:42 AM   LDLCALC 102 (H) 09/14/2019 08:42 AM    Wt Readings  from Last 3 Encounters:  09/19/19 275 lb (124.7 kg)  06/22/19 275 lb (124.7 kg)  06/01/19 278 lb 1.9 oz (126.2 kg)     Objective:    Vital Signs:  BP (!) 141/84   Ht 6\' 2"  (1.88 m)   Wt 275 lb (124.7 kg)   BMI 35.31 kg/m    VITAL SIGNS:  reviewed GEN:  alert and oriented  RESPIRATORY:  no shortness of breath in conversation  PSYCH:  normal affect and mood   ASSESSMENT & PLAN:    1. Morbid obesity ([place])   2.  Essential hypertension   3. Mixed  hyperlipidemia   4. Vitamin D deficiency     Time:   Today, I have spent 10 minutes with the patient with telehealth technology discussing the above problems.     Medication Adjustments/Labs and Tests Ordered: Current medicines are reviewed at length with the patient today.  Concerns regarding medicines are outlined above.   Tests Ordered: No orders of the defined types were placed in this encounter.   Medication Changes: No orders of the defined types were placed in this encounter.   Disposition:  Follow up 6 months  Signed, Perlie Mayo, NP  09/19/2019 10:16 AM     [facility]

## 2019-09-24 ENCOUNTER — Other Ambulatory Visit: Payer: Self-pay

## 2019-09-24 DIAGNOSIS — E782 Mixed hyperlipidemia: Secondary | ICD-10-CM

## 2019-09-24 MED ORDER — ROSUVASTATIN CALCIUM 5 MG PO TABS: 5.0000 mg | ORAL_TABLET | Freq: Every day | ORAL | 1 refills | Status: DC

## 2019-10-11 ENCOUNTER — Encounter: Payer: Self-pay | Admitting: Family Medicine

## 2019-10-11 ENCOUNTER — Other Ambulatory Visit: Payer: Self-pay

## 2019-10-11 ENCOUNTER — Telehealth (INDEPENDENT_AMBULATORY_CARE_PROVIDER_SITE_OTHER): Payer: 59 | Admitting: Family Medicine

## 2019-10-11 VITALS — BP 141/84 | Ht 74.0 in | Wt 275.0 lb

## 2019-10-11 DIAGNOSIS — Z7189 Other specified counseling: Secondary | ICD-10-CM

## 2019-10-11 NOTE — Progress Notes (Signed)
Virtual Visit via Telephone Note   This visit type was conducted due to national recommendations for restrictions regarding the COVID-19 Pandemic (e.g. social distancing) in an effort to limit this patient's exposure and mitigate transmission in our community.  Due to his co-morbid illnesses, this patient is at least at moderate risk for complications without adequate follow up.  This format is felt to be most appropriate for this patient at this time.  The patient did not have access to video technology/had technical difficulties with video requiring transitioning to audio format only (telephone).  All issues noted in this document were discussed and addressed.  No physical exam could be performed with this format.    Evaluation Performed:  Follow-up visit  Date:  10/11/2019   ID:  Danny, Rivers June 17, 1970, MRN [id number]  Patient Location: Home Provider Location: Office/Clinic  Location of Patient: Home Location of Provider: Telehealth Consent was obtain for visit to be over via telehealth. I verified that I am speaking with the correct person using two identifiers.  PCP:  Perlie Mayo, NP   Chief Complaint:  covid +  History of Present Illness:    Danny Rivers is a 49 y.o. male with recent history of coded 8.  Was vaccinated back in March however did develop symptoms and was tested for Covid and found to have positive test on 8 1.  Reports that he lost his taste and sense of smell but those have started to return.  Does not have any shortness of breath, cough, fevers, chills, headaches.  The patient does not have symptoms concerning for COVID-19 infection (fever, chills, cough, or new shortness of breath).   Past Medical, Surgical, Social History, Allergies, and Medications have been Reviewed.  Past Medical History:  Diagnosis Date  . Hemorrhoids   . History of colonic polyps   . Hypertension   . Rectal bleeding 05/26/2015   Past Surgical History:  Procedure  Laterality Date  . COLONOSCOPY N/A 06/13/2015   Dr.Rourk- tortuous colon, grade III moderate to medium sized hemorrhoids. , 27mm polyp in the cecum. bx= benign colonic mucosa  . HEMORRHOID BANDING  2017   Dr.Rourk  . VASECTOMY    . vastectomy       Current Meds  Medication Sig  . lisinopril (ZESTRIL) 20 MG tablet Take 1 tablet (20 mg total) by mouth daily.  . rosuvastatin (CRESTOR) 5 MG tablet Take 1 tablet (5 mg total) by mouth daily.     Allergies:   Patient has no known allergies.   ROS:   Please see the history of present illness.    All other systems reviewed and are negative.   Labs/Other Tests and Data Reviewed:    Recent Labs: 09/14/2019: ALT 32; BUN 12; Creat 1.14; Hemoglobin 14.6; Platelets 284; Potassium 3.9; Sodium 139   Recent Lipid Panel Lab Results  Component Value Date/Time   CHOL 159 09/14/2019 08:42 AM   TRIG 136 09/14/2019 08:42 AM   HDL 34 (L) 09/14/2019 08:42 AM   CHOLHDL 4.7 09/14/2019 08:42 AM   LDLCALC 102 (H) 09/14/2019 08:42 AM    Wt Readings from Last 3 Encounters:  10/11/19 275 lb (124.7 kg)  09/19/19 275 lb (124.7 kg)  06/22/19 275 lb (124.7 kg)     Objective:    Vital Signs:  BP (!) 141/84   Ht 6\' 2"  (1.88 m)   Wt 275 lb (124.7 kg)   BMI 35.31 kg/m    VITAL SIGNS:  reviewed GEN:  alert and oriented RESPIRATORY:  no shortness of breath in conversation  PSYCH:  normal affect and mood   ASSESSMENT & PLAN:    1. Educated about COVID-19 virus infection   Time:   Today, I have spent 10 minutes with the patient with telehealth technology discussing the above problems.     Medication Adjustments/Labs and Tests Ordered: Current medicines are reviewed at length with the patient today.  Concerns regarding medicines are outlined above.   Tests Ordered: No orders of the defined types were placed in this encounter.   Medication Changes: No orders of the defined types were placed in this encounter.   Disposition:  Follow up 2  months Signed, Perlie Mayo, NP  10/11/2019 1:55 PM     [facility]

## 2019-10-11 NOTE — Patient Instructions (Addendum)
I appreciate the opportunity to provide you with care for your health and wellness. Today we discussed: recent covid infection  Follow up: 2 months   No labs or referrals today  So glad you are feeling better. Please continue to stay safe.   Please continue to practice social distancing to keep you, your family, and our community safe.  If you must go out, please wear a mask and practice good handwashing.  It was a pleasure to see you and I look forward to continuing to work together on your health and well-being. Please do not hesitate to call the office if you need care or have questions about your care.  Have a wonderful day and week. With Gratitude, Cherly Beach, DNP, AGNP-BC

## 2019-12-12 ENCOUNTER — Ambulatory Visit: Payer: 59 | Admitting: Family Medicine

## 2020-01-07 ENCOUNTER — Emergency Department (HOSPITAL_COMMUNITY)
Admission: EM | Admit: 2020-01-07 | Discharge: 2020-01-07 | Disposition: A | Discharge status: Home / Self Care | Payer: 59 | Attending: Emergency Medicine | Admitting: Emergency Medicine

## 2020-01-07 ENCOUNTER — Encounter (HOSPITAL_COMMUNITY): Payer: Self-pay | Admitting: *Deleted

## 2020-01-07 ENCOUNTER — Other Ambulatory Visit: Payer: Self-pay

## 2020-01-07 DIAGNOSIS — R030 Elevated blood-pressure reading, without diagnosis of hypertension: Secondary | ICD-10-CM | POA: Insufficient documentation

## 2020-01-07 DIAGNOSIS — R42 Dizziness and giddiness: Secondary | ICD-10-CM | POA: Diagnosis present

## 2020-01-07 DIAGNOSIS — I10 Essential (primary) hypertension: Secondary | ICD-10-CM | POA: Insufficient documentation

## 2020-01-07 DIAGNOSIS — Z79899 Other long term (current) drug therapy: Secondary | ICD-10-CM | POA: Diagnosis not present

## 2020-01-07 LAB — BASIC METABOLIC PANEL
Anion gap: 8 (ref 5–15)
BUN: 9 mg/dL (ref 6–20)
CO2: 25 mmol/L (ref 22–32)
Calcium: 8.7 mg/dL — ABNORMAL LOW (ref 8.9–10.3)
Chloride: 101 mmol/L (ref 98–111)
Creatinine, Ser: 1.02 mg/dL (ref 0.61–1.24)
GFR, Estimated: >60 mL/min (ref >60)
Glucose, Bld: 95 mg/dL (ref 70–99)
Potassium: 4.1 mmol/L (ref 3.5–5.1)
Sodium: 134 mmol/L — ABNORMAL LOW (ref 135–145)

## 2020-01-07 LAB — URINALYSIS, ROUTINE W REFLEX MICROSCOPIC
Bilirubin Urine: NEGATIVE
Glucose, UA: NEGATIVE mg/dL
Hgb urine dipstick: NEGATIVE
Ketones, ur: NEGATIVE mg/dL
Leukocytes,Ua: NEGATIVE
Nitrite: NEGATIVE
Protein, ur: NEGATIVE mg/dL
Specific Gravity, Urine: 1.016 (ref 1.005–1.030)
pH: 5 (ref 5.0–8.0)

## 2020-01-07 LAB — CBC
HCT: 49 % (ref 39.0–52.0)
Hemoglobin: 15.7 g/dL (ref 13.0–17.0)
MCH: 28.7 pg (ref 26.0–34.0)
MCHC: 32 g/dL (ref 30.0–36.0)
MCV: 89.6 fL (ref 80.0–100.0)
Platelets: 251 10*3/uL (ref 150–400)
RBC: 5.47 MIL/uL (ref 4.22–5.81)
RDW: 12.6 % (ref 11.5–15.5)
WBC: 6 10*3/uL (ref 4.0–10.5)
nRBC: 0 % (ref 0.0–0.2)

## 2020-01-07 NOTE — Discharge Instructions (Addendum)
Contact a health care provider if you: Think you are having a reaction to a medicine you are taking. Have headaches that keep coming back (recurring). Feel dizzy. Have swelling in your ankles. Have trouble with your vision. Get help right away if you: Develop a severe headache or confusion. Have unusual weakness or numbness. Feel faint. Have severe pain in your chest or abdomen. Vomit repeatedly. Have trouble breathing. 

## 2020-01-07 NOTE — ED Triage Notes (Signed)
C/o dizziness, has not taken his medication for BP in a while. [place]

## 2020-01-07 NOTE — ED Provider Notes (Signed)
[facility] EMERGENCY DEPARTMENT Provider Note   CSN: [id number] Arrival date & time: 01/07/20  1142     History Chief Complaint  Patient presents with  . Dizziness    Danny Rivers is a 49 y.o. male who presents emergency department for foggy headed feeling.  Patient states when he awoke this morning he felt "foggy and off."  His wife took his blood pressure his systolic pressure was about 180 he.  He has not taken his blood pressure medication in over a week and so he took his blood pressure medication.  He denies feelings of vertigo.  He had no disequilibrium, ataxia, headache, changes in vision, unilateral weakness, difficulty with speech or swallowing.  He denies having any symptoms of dizziness or vertigo at this time.  His feeling was transient and resolved rapidly.  HPI     Past Medical History:  Diagnosis Date  . Hemorrhoids   . History of colonic polyps   . Hypertension   . Rectal bleeding 05/26/2015    Patient Active Problem List   Diagnosis Date Noted  . Educated about COVID-19 virus infection 10/11/2019  . Vitamin D deficiency 06/22/2019  . Morbid obesity ([place]) 06/01/2019  . Essential hypertension 06/01/2019  . Mixed hyperlipidemia 06/01/2019  . Fatigue 06/01/2019    Past Surgical History:  Procedure Laterality Date  . COLONOSCOPY N/A 06/13/2015   Dr.Rourk- tortuous colon, grade III moderate to medium sized hemorrhoids. , 58mm polyp in the cecum. bx= benign colonic mucosa  . HEMORRHOID BANDING  2017   Dr.Rourk  . VASECTOMY    . vastectomy         History reviewed. No pertinent family history.  Social History   Tobacco Use  . Smoking status: Never Smoker  . Smokeless tobacco: Never Used  . Tobacco comment: never smoked  Vaping Use  . Vaping Use: Never assessed  Substance Use Topics  . Alcohol use: No    Alcohol/week: 0.0 standard drinks  . Drug use: No    Home Medications Prior to Admission medications   Medication Sig Start Date End Date  Taking? Authorizing Provider  lisinopril (ZESTRIL) 20 MG tablet Take 1 tablet (20 mg total) by mouth daily. 06/22/19   Perlie Mayo, NP  rosuvastatin (CRESTOR) 5 MG tablet Take 1 tablet (5 mg total) by mouth daily. 09/24/19   Perlie Mayo, NP    Allergies    Patient has no known allergies.  Review of Systems   Review of Systems Ten systems reviewed and are negative for acute change, except as noted in the HPI.   Physical Exam Updated Vital Signs BP (!) 146/96 (BP Location: Right Arm)   Pulse 71   Temp 97.7 F (36.5 C) (Oral)   Resp 20   Ht 6\' 2"  (1.88 m)   Wt 117.9 kg   SpO2 98%   BMI 33.38 kg/m   Physical Exam Vitals and nursing note reviewed.  Constitutional:      General: He is not in acute distress.    Appearance: He is well-developed. He is not diaphoretic.  HENT:     Head: Normocephalic and atraumatic.  Eyes:     General: No scleral icterus.    Conjunctiva/sclera: Conjunctivae normal.     Pupils: Pupils are equal, round, and reactive to light.     Comments: No horizontal, vertical or rotational nystagmus  Cardiovascular:     Rate and Rhythm: Normal rate and regular rhythm.  Pulmonary:     Effort:  Pulmonary effort is normal. No respiratory distress.     Breath sounds: Normal breath sounds. No wheezing or rales.  Abdominal:     General: Bowel sounds are normal.     Palpations: Abdomen is soft.     Tenderness: There is no abdominal tenderness. There is no guarding or rebound.  Musculoskeletal:        General: Normal range of motion.     Cervical back: Normal range of motion and neck supple.  Lymphadenopathy:     Cervical: No cervical adenopathy.  Skin:    General: Skin is warm and dry.     Findings: No rash.  Neurological:     Mental Status: He is alert and oriented to person, place, and time.     Cranial Nerves: No cranial nerve deficit.     Motor: No abnormal muscle tone.     Coordination: Coordination normal.     Comments: Mental Status:  Alert,  oriented, thought content appropriate. Speech fluent without evidence of aphasia. Able to follow 2 step commands without difficulty.  Cranial Nerves:  II:  Peripheral visual fields grossly normal, pupils equal, round, reactive to light III,IV, VI: ptosis not present, extra-ocular motions intact bilaterally  V,VII: smile symmetric, facial light touch sensation equal VIII: hearing grossly normal bilaterally  IX,X: midline uvula rise  XI: bilateral shoulder shrug equal and strong XII: midline tongue extension  Motor:  5/5 in upper and lower extremities bilaterally including strong and equal grip strength and dorsiflexion/plantar flexion Sensory: Pinprick and light touch normal in all extremities.  Cerebellar: normal finger-to-nose with bilateral upper extremities Gait: normal gait and balance CV: distal pulses palpable throughout   Psychiatric:        Behavior: Behavior normal.        Thought Content: Thought content normal.        Judgment: Judgment normal.     ED Results / Procedures / Treatments   Labs (all labs ordered are listed, but only abnormal results are displayed) Labs Reviewed  BASIC METABOLIC PANEL - Abnormal; Notable for the following components:      Result Value   Sodium 134 (*)    Calcium 8.7 (*)    All other components within normal limits  CBC  URINALYSIS, ROUTINE W REFLEX MICROSCOPIC  CBG MONITORING, ED    EKG EKG Interpretation  Date/Time:  Monday January 07 2020 12:14:28 EDT Ventricular Rate:  64 PR Interval:  160 QRS Duration: 92 QT Interval:  386 QTC Calculation: 398 R Axis:   73 Text Interpretation: Normal sinus rhythm with sinus arrhythmia T wave abnormality, consider inferolateral ischemia Abnormal ECG No old tracing for comparison No STEMI Confirmed by Nanda Quinton [phone]) on 01/07/2020 12:48:00 PM   Radiology No results found.  Procedures Procedures (including critical care time)  Medications Ordered in ED Medications - No data to  display  ED Course  I have reviewed the triage vital signs and the nursing notes.  Pertinent labs & imaging results that were available during my care of the patient were reviewed by me and considered in my medical decision making (see chart for details).    MDM Rules/Calculators/A&P                           The patient was noted to have elevated BP in ED today. Hx of HTN and did takedaily medications today. I have spoken with the patient regarding elevated blood pressure readings and the need  for improved management and medication compliance. No evidence of vertigo, normal neurologic examination.  I do not think that his symptoms reflect TIA. I ordered and reviewed labs which include CBC which is without abnormality, BMP with mildly low sodium of insignificant value, urinalysis is clear.  I reviewed the EKG which shows normal sinus rhythm at a rate of 64. I instructed the patient to followup with their PCP within 1 week for BP check. I also counseled the patient regarding the signs and symptoms which would require an emergent visit to an emergency department for hypertensive urgency and/or hypertensive emergency.  Final Clinical Impression(s) / ED Diagnoses Final diagnoses:  Elevated blood pressure reading    Rx / DC Orders ED Discharge Orders    None       Margarita Mail, PA-C 01/07/20 1646    Daleen Bo, MD 01/08/20 1217

## 2020-01-14 ENCOUNTER — Other Ambulatory Visit: Payer: Self-pay

## 2020-01-14 DIAGNOSIS — I10 Essential (primary) hypertension: Secondary | ICD-10-CM

## 2020-01-14 MED ORDER — LISINOPRIL 20 MG PO TABS: 20.0000 mg | ORAL_TABLET | Freq: Every day | ORAL | 2 refills | Status: DC

## 2020-05-06 ENCOUNTER — Other Ambulatory Visit: Payer: Self-pay | Admitting: Family Medicine

## 2020-05-06 DIAGNOSIS — E559 Vitamin D deficiency, unspecified: Secondary | ICD-10-CM

## 2020-05-20 ENCOUNTER — Encounter: Payer: 59 | Admitting: Family Medicine

## 2020-05-20 ENCOUNTER — Encounter: Payer: 59 | Admitting: Nurse Practitioner

## 2020-06-09 ENCOUNTER — Other Ambulatory Visit: Payer: Self-pay

## 2020-06-09 DIAGNOSIS — E782 Mixed hyperlipidemia: Secondary | ICD-10-CM

## 2020-06-09 DIAGNOSIS — I10 Essential (primary) hypertension: Secondary | ICD-10-CM

## 2020-06-09 MED ORDER — ROSUVASTATIN CALCIUM 5 MG PO TABS: 5.0000 mg | ORAL_TABLET | Freq: Every day | ORAL | 1 refills | Status: DC

## 2020-06-09 MED ORDER — LISINOPRIL 20 MG PO TABS: 20.0000 mg | ORAL_TABLET | Freq: Every day | ORAL | 2 refills | Status: DC

## 2020-08-12 ENCOUNTER — Telehealth (INDEPENDENT_AMBULATORY_CARE_PROVIDER_SITE_OTHER): Payer: 59 | Admitting: Nurse Practitioner

## 2020-08-12 ENCOUNTER — Other Ambulatory Visit: Payer: Self-pay

## 2020-08-12 ENCOUNTER — Encounter: Payer: Self-pay | Admitting: Nurse Practitioner

## 2020-08-12 ENCOUNTER — Telehealth: Payer: 59 | Admitting: Nurse Practitioner

## 2020-08-12 DIAGNOSIS — J329 Chronic sinusitis, unspecified: Secondary | ICD-10-CM | POA: Insufficient documentation

## 2020-08-12 DIAGNOSIS — J019 Acute sinusitis, unspecified: Secondary | ICD-10-CM | POA: Diagnosis not present

## 2020-08-12 MED ORDER — NOREL AD 4-10-325 MG PO TABS: 1.0000 | ORAL_TABLET | ORAL | 1 refills | Status: DC | PRN

## 2020-08-12 MED ORDER — ALBUTEROL SULFATE HFA 108 (90 BASE) MCG/ACT IN AERS: 2.0000 | INHALATION_SPRAY | Freq: Four times a day (QID) | RESPIRATORY_TRACT | 0 refills | Status: DC | PRN

## 2020-08-12 MED ORDER — AZITHROMYCIN 250 MG PO TABS: ORAL_TABLET | ORAL | 0 refills | Status: DC

## 2020-08-12 NOTE — Assessment & Plan Note (Signed)
-  Rx. z-pack, norel, and albuterol -RTC in 1 week if no improvement

## 2020-08-12 NOTE — Progress Notes (Signed)
Acute Office Visit  Subjective:    Patient ID: Danny Rivers, male    DOB: Jan 27, 1971, 50 y.o.   MRN: [id number]  Chief Complaint  Patient presents with  . Cough    X 1 week  . Nasal Congestion    X 1 week    HPI Patient is in today for sick visit. He has taken theraflu cold and cough, and that isn't helping much. He took a covid test Sunday 08/10/20, and that was negative.  Past Medical History:  Diagnosis Date  . Hemorrhoids   . History of colonic polyps   . Hypertension   . Rectal bleeding 05/26/2015    Past Surgical History:  Procedure Laterality Date  . COLONOSCOPY N/A 06/13/2015   Dr.Rourk- tortuous colon, grade III moderate to medium sized hemorrhoids. , 67mm polyp in the cecum. bx= benign colonic mucosa  . HEMORRHOID BANDING  2017   Dr.Rourk  . VASECTOMY    . vastectomy      No family history on file.  Social History   Socioeconomic History  . Marital status: Married    Spouse name: Cicly   . Number of children: 6  . Years of education: Not on file  . Highest education level: Bachelor's degree (e.g., BA, AB, BS)  Occupational History  . Not on file  Tobacco Use  . Smoking status: Never Smoker  . Smokeless tobacco: Never Used  . Tobacco comment: never smoked  Vaping Use  . Vaping Use: Not on file  Substance and Sexual Activity  . Alcohol use: No    Alcohol/week: 0.0 standard drinks  . Drug use: No  . Sexual activity: Yes    Partners: Female    Birth control/protection: Surgical  Other Topics Concern  . Not on file  Social History Narrative   Lives with wife Cicly    6 children-3 with wife which live with him.   2 children are grown and the other is 56 and lives with mother.      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 lisinopril (ZESTRIL) 20 MG tablet Take 1 tablet (20 mg total) by mouth daily. 30 tablet 2  . rosuvastatin (CRESTOR) 5 MG tablet Take 1 tablet (5 mg total) by mouth daily. 30 tablet 1  . Vitamin D, Ergocalciferol, (DRISDOL) 1.25 MG (50000 UNIT) CAPS capsule TAKE 1 CAPSULE BY MOUTH EVERY 7 DAYS 4 capsule 3   No facility-administered medications prior to visit.    No Known Allergies  Review of Systems  Constitutional: Negative for chills, fatigue and fever.  HENT: Positive for congestion, postnasal drip and sore throat. Negative for ear pain, sinus pressure and sinus pain.   Respiratory: Positive for cough, shortness of breath and wheezing.  Cardiovascular: Negative.   Gastrointestinal: Negative.        Objective:    Physical Exam  There were no vitals taken for this visit. Wt Readings from Last 3 Encounters:  01/07/20 260 lb (117.9 kg)  10/11/19 275 lb (124.7 kg)  09/19/19 275 lb (124.7 kg)    Health Maintenance Due  Topic Date Due  . HIV Screening  Never done  . Hepatitis C Screening  Never done  . TETANUS/TDAP  Never done  . COVID-19 Vaccine (3 - Booster for Moderna series) 11/05/2019    There are no preventive care reminders to display for this patient.   No results found for: TSH Lab Results  Component Value Date   WBC 6.0 01/07/2020   HGB 15.7 01/07/2020   HCT 49.0 01/07/2020   MCV 89.6 01/07/2020   PLT 251 01/07/2020   Lab Results  Component Value Date   NA 134 (L) 01/07/2020   K 4.1 01/07/2020   CO2 25 01/07/2020   GLUCOSE 95 01/07/2020   BUN 9 01/07/2020   CREATININE 1.02  01/07/2020   BILITOT 0.2 09/14/2019   ALKPHOS 72 07/07/2015   AST 19 09/14/2019   ALT 32 09/14/2019   PROT 6.8 09/14/2019   ALBUMIN 3.9 07/07/2015   CALCIUM 8.7 (L) 01/07/2020   ANIONGAP 8 01/07/2020   Lab Results  Component Value Date   CHOL 159 09/14/2019   Lab Results  Component Value Date   HDL 34 (L) 09/14/2019   Lab Results  Component Value Date   LDLCALC 102 (H) 09/14/2019   Lab Results  Component Value Date   TRIG 136 09/14/2019   Lab Results  Component Value Date   CHOLHDL 4.7 09/14/2019   Lab Results  Component Value Date   HGBA1C 5.5 09/14/2019       Assessment & Plan:   Problem List Items Addressed This Visit      Respiratory   Sinusitis - Primary    -Rx. z-pack, norel, and albuterol -RTC in 1 week if no improvement      Relevant Medications   azithromycin (ZITHROMAX) 250 MG tablet   Chlorphen-PE-Acetaminophen (NOREL AD) 4-10-325 MG TABS   albuterol (VENTOLIN HFA) 108 (90 Base) MCG/ACT inhaler       Meds ordered this encounter  Medications  . azithromycin (ZITHROMAX) 250 MG tablet    Sig: Please dispense as a z-pack    Dispense:  6 tablet    Refill:  0  . Chlorphen-PE-Acetaminophen (NOREL AD) 4-10-325 MG TABS    Sig: Take 1 tablet by mouth every 4 (four) hours as needed (nasal congestion, cold symptoms).    Dispense:  20 tablet    Refill:  1  . albuterol (VENTOLIN HFA) 108 (90 Base) MCG/ACT inhaler    Sig: Inhale 2 puffs into the lungs every 6 (six) hours as needed for wheezing or shortness of breath.    Dispense:  8 g    Refill:  0   Date:  08/12/2020   Location of Patient: Home Location of Provider: Office Consent was obtain for visit to be over via telehealth. I verified that I am speaking with the correct person using two identifiers.  I connected with  Danny Rivers on 08/12/20 via telephone and verified that I am speaking with the correct person using two identifiers.   I discussed the limitations of evaluation and  management by telemedicine. The patient expressed understanding and agreed to proceed.  Time spent: 8 minutes   Deklyn Gibbon  Jonetta Osgood, NP

## 2020-08-25 ENCOUNTER — Ambulatory Visit (INDEPENDENT_AMBULATORY_CARE_PROVIDER_SITE_OTHER): Payer: 59 | Admitting: Nurse Practitioner

## 2020-08-25 ENCOUNTER — Other Ambulatory Visit: Payer: Self-pay

## 2020-08-25 ENCOUNTER — Encounter: Payer: Self-pay | Admitting: Nurse Practitioner

## 2020-08-25 VITALS — BP 154/90 | HR 78 | Temp 97.4°F | Ht 74.0 in | Wt 277.0 lb

## 2020-08-25 DIAGNOSIS — R030 Elevated blood-pressure reading, without diagnosis of hypertension: Secondary | ICD-10-CM

## 2020-08-25 DIAGNOSIS — Z0001 Encounter for general adult medical examination with abnormal findings: Secondary | ICD-10-CM

## 2020-08-25 DIAGNOSIS — E559 Vitamin D deficiency, unspecified: Secondary | ICD-10-CM | POA: Diagnosis not present

## 2020-08-25 DIAGNOSIS — Z0000 Encounter for general adult medical examination without abnormal findings: Secondary | ICD-10-CM | POA: Insufficient documentation

## 2020-08-25 DIAGNOSIS — E782 Mixed hyperlipidemia: Secondary | ICD-10-CM | POA: Diagnosis not present

## 2020-08-25 DIAGNOSIS — E669 Obesity, unspecified: Secondary | ICD-10-CM

## 2020-08-25 DIAGNOSIS — Z139 Encounter for screening, unspecified: Secondary | ICD-10-CM | POA: Insufficient documentation

## 2020-08-25 NOTE — Assessment & Plan Note (Signed)
-  screening for HCV and HIV with next set of labs

## 2020-08-25 NOTE — Assessment & Plan Note (Signed)
-  BP elevated today -he states he hasn't taken his lisinopril today, but home readings have been great

## 2020-08-25 NOTE — Assessment & Plan Note (Signed)
BMI Readings from Last 3 Encounters:  08/25/20 35.56 kg/m  01/07/20 33.38 kg/m  10/11/19 35.31 kg/m

## 2020-08-25 NOTE — Patient Instructions (Signed)
Please have fasting labs drawn today. 

## 2020-08-25 NOTE — Progress Notes (Signed)
Established Patient Office Visit  Subjective:  Patient ID: Danny Rivers, male    DOB: [birth year]  Age: 50 y.o. MRN: [id number]  CC:  Chief Complaint  Patient presents with   Annual Exam    CPE    HPI Danny Rivers presents for physical exam. No acute concerns. His sinus issues are much better today.  He states his home BP has been under 140/90 and is usually 120s/70s. He didn't take his lisinopril this AM.  Past Medical History:  Diagnosis Date   Hemorrhoids    History of colonic polyps    Hypertension    Rectal bleeding 05/26/2015    Past Surgical History:  Procedure Laterality Date   COLONOSCOPY N/A 06/13/2015   Dr.Rourk- tortuous colon, grade III moderate to medium sized hemorrhoids. , 87mm polyp in the cecum. bx= benign colonic mucosa   HEMORRHOID BANDING  2017   Dr.Rourk   VASECTOMY     vastectomy      History reviewed. No pertinent family history.  Social History   Socioeconomic History   Marital status: Married    Spouse name: Cicly    Number of children: 6   Years of education: Not on file   Highest education level: Bachelor's degree (e.g., BA, AB, BS)  Occupational History   Not on file  Tobacco Use   Smoking status: Never   Smokeless tobacco: Never   Tobacco comments:    never smoked  Vaping Use   Vaping Use: Not on file  Substance and Sexual Activity   Alcohol use: No    Alcohol/week: 0.0 standard drinks   Drug use: No   Sexual activity: Yes    Partners: Female    Birth control/protection: Surgical  Other Topics Concern   Not on file  Social History Narrative   Lives with wife Cicly    6 children-3 with wife which live with him.   2 children are grown and the other is 14 and lives with mother.      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       Diet: does not eat pork   Caffeine: 1 soda daily, does not drink tea or coffee   Water: 6 cups daily       Wears seat belt   Does not  use phone while driving    Psychologist, sport and exercise at home    Social Determinants of Health   Financial Resource Strain: Not on file  Food Insecurity: Not on file  Transportation Needs: Not on file  Physical Activity: Not on file  Stress: Not on file  Social Connections: Not on file  Intimate Partner Violence: Not on file    Outpatient Medications Prior to Visit  Medication Sig Dispense Refill   albuterol (VENTOLIN HFA) 108 (90 Base) MCG/ACT inhaler Inhale 2 puffs into the lungs every 6 (six) hours as needed for wheezing or shortness of breath. 8 g 0   lisinopril (ZESTRIL) 20 MG tablet Take 1 tablet (20 mg total) by mouth daily. 30 tablet 2   rosuvastatin (CRESTOR) 5 MG tablet Take 1 tablet (5 mg total) by mouth daily. 30 tablet 1   Vitamin D, Ergocalciferol, (DRISDOL) 1.25 MG (50000 UNIT) CAPS capsule TAKE 1 CAPSULE BY MOUTH EVERY 7 DAYS (Patient not taking: Reported on 08/25/2020) 4 capsule 3   azithromycin (ZITHROMAX) 250 MG tablet Please dispense as a z-pack 6 tablet 0   Chlorphen-PE-Acetaminophen (NOREL  AD) 4-10-325 MG TABS Take 1 tablet by mouth every 4 (four) hours as needed (nasal congestion, cold symptoms). 20 tablet 1   No facility-administered medications prior to visit.    No Known Allergies  ROS Review of Systems  Constitutional: Negative.   HENT: Negative.    Eyes: Negative.   Respiratory: Negative.    Cardiovascular: Negative.   Gastrointestinal: Negative.   Endocrine: Negative.   Genitourinary: Negative.   Musculoskeletal: Negative.   Skin: Negative.   Allergic/Immunologic: Negative.   Neurological: Negative.   Hematological: Negative.   Psychiatric/Behavioral: Negative.       Objective:    Physical Exam Constitutional:      Appearance: Normal appearance.  HENT:     Head: Normocephalic and atraumatic.     Right Ear: Tympanic membrane, ear canal and external ear normal.     Left Ear: Tympanic membrane, ear canal and external ear normal.     Nose: Nose  normal.     Mouth/Throat:     Mouth: Mucous membranes are moist.     Pharynx: Oropharynx is clear.  Eyes:     Extraocular Movements: Extraocular movements intact.     Conjunctiva/sclera: Conjunctivae normal.     Pupils: Pupils are equal, round, and reactive to light.  Cardiovascular:     Rate and Rhythm: Normal rate and regular rhythm.     Pulses: Normal pulses.     Heart sounds: Normal heart sounds.  Pulmonary:     Effort: Pulmonary effort is normal.     Breath sounds: Normal breath sounds.  Abdominal:     General: Abdomen is flat. Bowel sounds are normal.  Musculoskeletal:        General: Normal range of motion.     Cervical back: Normal range of motion and neck supple.  Skin:    General: Skin is warm and dry.     Capillary Refill: Capillary refill takes less than 2 seconds.  Neurological:     General: No focal deficit present.     Mental Status: He is alert and oriented to person, place, and time.     Cranial Nerves: No cranial nerve deficit.     Sensory: No sensory deficit.     Motor: No weakness.     Coordination: Coordination normal.     Gait: Gait normal.  Psychiatric:        Mood and Affect: Mood normal.        Behavior: Behavior normal.        Thought Content: Thought content normal.        Judgment: Judgment normal.    BP (!) 154/90 (BP Location: Left Arm, Patient Position: Sitting, Cuff Size: Large)   Pulse 78   Temp (!) 97.4 F (36.3 C) (Temporal)   Ht $R'6\' 2"'rw$  (1.88 m)   Wt 277 lb (125.6 kg)   SpO2 96%   BMI 35.56 kg/m  Wt Readings from Last 3 Encounters:  08/25/20 277 lb (125.6 kg)  01/07/20 260 lb (117.9 kg)  10/11/19 275 lb (124.7 kg)     Health Maintenance Due  Topic Date Due   HIV Screening  Never done   Hepatitis C Screening  Never done   TETANUS/TDAP  Never done   COVID-19 Vaccine (3 - Booster for Moderna series) 11/05/2019    There are no preventive care reminders to display for this patient.  No results found for: TSH Lab Results   Component Value Date   WBC 6.0 01/07/2020   HGB  15.7 01/07/2020   HCT 49.0 01/07/2020   MCV 89.6 01/07/2020   PLT 251 01/07/2020   Lab Results  Component Value Date   NA 134 (L) 01/07/2020   K 4.1 01/07/2020   CO2 25 01/07/2020   GLUCOSE 95 01/07/2020   BUN 9 01/07/2020   CREATININE 1.02 01/07/2020   BILITOT 0.2 09/14/2019   ALKPHOS 72 07/07/2015   AST 19 09/14/2019   ALT 32 09/14/2019   PROT 6.8 09/14/2019   ALBUMIN 3.9 07/07/2015   CALCIUM 8.7 (L) 01/07/2020   ANIONGAP 8 01/07/2020   Lab Results  Component Value Date   CHOL 159 09/14/2019   Lab Results  Component Value Date   HDL 34 (L) 09/14/2019   Lab Results  Component Value Date   LDLCALC 102 (H) 09/14/2019   Lab Results  Component Value Date   TRIG 136 09/14/2019   Lab Results  Component Value Date   CHOLHDL 4.7 09/14/2019   Lab Results  Component Value Date   HGBA1C 5.5 09/14/2019      Assessment & Plan:   Problem List Items Addressed This Visit       Other   Obesity (BMI 35.0-39.9 without comorbidity)    BMI Readings from Last 3 Encounters:  08/25/20 35.56 kg/m  01/07/20 33.38 kg/m  10/11/19 35.31 kg/m         Mixed hyperlipidemia   Relevant Orders   Lipid Panel With LDL/HDL Ratio   Vitamin D deficiency   Relevant Orders   VITAMIN D 25 Hydroxy (Vit-D Deficiency, Fractures)   Encounter for general adult medical examination with abnormal findings - Primary   Relevant Orders   CBC with Differential/Platelet   CMP14+EGFR   Lipid Panel With LDL/HDL Ratio   Hepatitis C antibody   HIV Antibody (routine testing w rflx)   VITAMIN D 25 Hydroxy (Vit-D Deficiency, Fractures)   Screening due    -screening for HCV and HIV with next set of labs       Relevant Orders   Hepatitis C antibody   HIV Antibody (routine testing w rflx)   Elevated BP without diagnosis of hypertension    -BP elevated today -he states he hasn't taken his lisinopril today, but home readings have been  great        No orders of the defined types were placed in this encounter.   Follow-up: Return in about 1 year (around 08/25/2021) for Physical exam (same-day fasting labs).    Noreene Larsson, NP

## 2020-08-26 ENCOUNTER — Other Ambulatory Visit: Payer: Self-pay | Admitting: Nurse Practitioner

## 2020-08-26 DIAGNOSIS — E782 Mixed hyperlipidemia: Secondary | ICD-10-CM

## 2020-08-26 DIAGNOSIS — E559 Vitamin D deficiency, unspecified: Secondary | ICD-10-CM

## 2020-08-26 LAB — CBC WITH DIFFERENTIAL/PLATELET
Basophils Absolute: 0.1 10*3/uL (ref 0.0–0.2)
Basos: 1 %
EOS (ABSOLUTE): 0.1 10*3/uL (ref 0.0–0.4)
Eos: 2 %
Hematocrit: 45.2 % (ref 37.5–51.0)
Hemoglobin: 15.1 g/dL (ref 13.0–17.7)
Immature Grans (Abs): 0 10*3/uL (ref 0.0–0.1)
Immature Granulocytes: 0 %
Lymphocytes Absolute: 2.3 10*3/uL (ref 0.7–3.1)
Lymphs: 34 %
MCH: 28.8 pg (ref 26.6–33.0)
MCHC: 33.4 g/dL (ref 31.5–35.7)
MCV: 86 fL (ref 79–97)
Monocytes Absolute: 0.5 10*3/uL (ref 0.1–0.9)
Monocytes: 8 %
Neutrophils Absolute: 3.8 10*3/uL (ref 1.4–7.0)
Neutrophils: 55 %
Platelets: 269 10*3/uL (ref 150–450)
RBC: 5.25 x10E6/uL (ref 4.14–5.80)
RDW: 12.3 % (ref 11.6–15.4)
WBC: 6.8 10*3/uL (ref 3.4–10.8)

## 2020-08-26 LAB — CMP14+EGFR
ALT: 34 IU/L (ref 0–44)
AST: 15 IU/L (ref 0–40)
Albumin/Globulin Ratio: 1.7 (ref 1.2–2.2)
Albumin: 4.7 g/dL (ref 4.0–5.0)
Alkaline Phosphatase: 93 IU/L (ref 44–121)
BUN/Creatinine Ratio: 12 (ref 9–20)
BUN: 12 mg/dL (ref 6–24)
Bilirubin Total: 0.4 mg/dL (ref 0.0–1.2)
CO2: 21 mmol/L (ref 20–29)
Calcium: 9.3 mg/dL (ref 8.7–10.2)
Chloride: 101 mmol/L (ref 96–106)
Creatinine, Ser: 1.02 mg/dL (ref 0.76–1.27)
Globulin, Total: 2.8 g/dL (ref 1.5–4.5)
Glucose: 86 mg/dL (ref 65–99)
Potassium: 4.3 mmol/L (ref 3.5–5.2)
Sodium: 137 mmol/L (ref 134–144)
Total Protein: 7.5 g/dL (ref 6.0–8.5)
eGFR: 90 mL/min/{1.73_m2} (ref >59)

## 2020-08-26 LAB — LIPID PANEL WITH LDL/HDL RATIO
Cholesterol, Total: 240 mg/dL — ABNORMAL HIGH (ref 100–199)
HDL: 41 mg/dL (ref >39)
LDL Chol Calc (NIH): 160 mg/dL — ABNORMAL HIGH (ref 0–99)
LDL/HDL Ratio: 3.9 ratio — ABNORMAL HIGH (ref 0.0–3.6)
Triglycerides: 211 mg/dL — ABNORMAL HIGH (ref 0–149)
VLDL Cholesterol Cal: 39 mg/dL (ref 5–40)

## 2020-08-26 LAB — HEPATITIS C ANTIBODY: Hep C Virus Ab: 0.1 s/co ratio (ref 0.0–0.9)

## 2020-08-26 LAB — HIV ANTIBODY (ROUTINE TESTING W REFLEX): HIV Screen 4th Generation wRfx: NONREACTIVE

## 2020-08-26 LAB — VITAMIN D 25 HYDROXY (VIT D DEFICIENCY, FRACTURES): Vit D, 25-Hydroxy: 25 ng/mL — ABNORMAL LOW (ref 30.0–100.0)

## 2020-08-26 MED ORDER — ROSUVASTATIN CALCIUM 20 MG PO TABS: 20.0000 mg | ORAL_TABLET | Freq: Every day | ORAL | 3 refills | Status: DC

## 2020-08-26 MED ORDER — VITAMIN D (ERGOCALCIFEROL) 1.25 MG (50000 UNIT) PO CAPS: 50000.0000 [IU] | ORAL_CAPSULE | ORAL | 0 refills | Status: DC

## 2020-08-26 NOTE — Progress Notes (Signed)
Vitamin D is low, and cholesterol is elevated. I'll send in vit D and increase his cholesterol medicine.

## 2020-12-25 ENCOUNTER — Other Ambulatory Visit: Payer: Self-pay | Admitting: Nurse Practitioner

## 2020-12-25 ENCOUNTER — Other Ambulatory Visit: Payer: Self-pay | Admitting: Family Medicine

## 2020-12-25 DIAGNOSIS — J019 Acute sinusitis, unspecified: Secondary | ICD-10-CM

## 2020-12-25 DIAGNOSIS — E559 Vitamin D deficiency, unspecified: Secondary | ICD-10-CM

## 2020-12-25 MED ORDER — VITAMIN D (ERGOCALCIFEROL) 1.25 MG (50000 UNIT) PO CAPS: 50000.0000 [IU] | ORAL_CAPSULE | ORAL | 0 refills | Status: DC

## 2020-12-25 MED ORDER — ALBUTEROL SULFATE HFA 108 (90 BASE) MCG/ACT IN AERS: 2.0000 | INHALATION_SPRAY | Freq: Four times a day (QID) | RESPIRATORY_TRACT | 0 refills | Status: DC | PRN

## 2021-08-17 ENCOUNTER — Other Ambulatory Visit: Payer: Self-pay | Admitting: Family Medicine

## 2021-08-17 DIAGNOSIS — E559 Vitamin D deficiency, unspecified: Secondary | ICD-10-CM

## 2021-08-17 MED ORDER — VITAMIN D (ERGOCALCIFEROL) 1.25 MG (50000 UNIT) PO CAPS: 50000.0000 [IU] | ORAL_CAPSULE | ORAL | 0 refills | Status: DC

## 2021-08-24 ENCOUNTER — Encounter: Payer: 59 | Admitting: Nurse Practitioner

## 2021-09-24 ENCOUNTER — Ambulatory Visit (INDEPENDENT_AMBULATORY_CARE_PROVIDER_SITE_OTHER): Payer: BC Managed Care – PPO | Admitting: Nurse Practitioner

## 2021-09-24 ENCOUNTER — Encounter: Payer: Self-pay | Admitting: Nurse Practitioner

## 2021-09-24 VITALS — BP 150/90 | HR 76 | Ht 74.0 in | Wt 282.0 lb

## 2021-09-24 DIAGNOSIS — E661 Drug-induced obesity: Secondary | ICD-10-CM

## 2021-09-24 DIAGNOSIS — E559 Vitamin D deficiency, unspecified: Secondary | ICD-10-CM | POA: Diagnosis not present

## 2021-09-24 DIAGNOSIS — Z23 Encounter for immunization: Secondary | ICD-10-CM

## 2021-09-24 DIAGNOSIS — Z0001 Encounter for general adult medical examination with abnormal findings: Secondary | ICD-10-CM | POA: Diagnosis not present

## 2021-09-24 DIAGNOSIS — I10 Essential (primary) hypertension: Secondary | ICD-10-CM | POA: Diagnosis not present

## 2021-09-24 DIAGNOSIS — Z0000 Encounter for general adult medical examination without abnormal findings: Secondary | ICD-10-CM | POA: Diagnosis not present

## 2021-09-24 DIAGNOSIS — E66812 Obesity, class 2: Secondary | ICD-10-CM

## 2021-09-24 DIAGNOSIS — R0683 Snoring: Secondary | ICD-10-CM

## 2021-09-24 DIAGNOSIS — Z6836 Body mass index (BMI) 36.0-36.9, adult: Secondary | ICD-10-CM

## 2021-09-24 DIAGNOSIS — E782 Mixed hyperlipidemia: Secondary | ICD-10-CM | POA: Diagnosis not present

## 2021-09-24 MED ORDER — LISINOPRIL 20 MG PO TABS: 20.0000 mg | ORAL_TABLET | Freq: Every day | ORAL | 1 refills | Status: DC

## 2021-09-24 MED ORDER — ROSUVASTATIN CALCIUM 20 MG PO TABS: 20.0000 mg | ORAL_TABLET | Freq: Every day | ORAL | 3 refills | Status: DC

## 2021-09-24 NOTE — Assessment & Plan Note (Addendum)
He has stopped taking crestor  Restart Crestor 20 mg daily medication refill Avoid fried fatty foods

## 2021-09-24 NOTE — Patient Instructions (Addendum)
TDAP and shingles vaccine today , home sleep study ordered today    Please start taking lisinopril '20mg'$  daily , rosuvastatin '20mg'$  daily. Monitor blood pressure daily at home, keep a log and bring to next appointment in 4 weeks . Blood pressure goal is less than 140/90   It is important that you exercise regularly at least 30 minutes 5 times a week.  Think about what you will eat, plan ahead. Choose " clean, green, fresh or frozen" over canned, processed or packaged foods which are more sugary, salty and fatty. 70 to 75% of food eaten should be vegetables and fruit. Three meals at set times with snacks allowed between meals, but they must be fruit or vegetables. Aim to eat over a 12 hour period , example 7 am to 7 pm, and STOP after  your last meal of the day. Drink water,generally about 64 ounces per day, no other drink is as healthy. Fruit juice is best enjoyed in a healthy way, by EATING the fruit.  Thanks for choosing [facility], we consider it a privelige to serve you.

## 2021-09-24 NOTE — Assessment & Plan Note (Signed)
Home sleep study test ordered today Patient encouraged to lose weight

## 2021-09-24 NOTE — Assessment & Plan Note (Signed)
Patient educated on CDC recommendation for the  TDAP vaccine. Verbal consent was obtained from the patient, vaccine administered by nurse, no sign of adverse reactions noted at this time. Patient education on arm soreness and use of tylenol or this patient  was discussed. Patient educated on the signs and symptoms of adverse effect and advise to contact the office if they occur.  

## 2021-09-24 NOTE — Assessment & Plan Note (Signed)
Currently not on vitamin D supplement Check vitamin D levels today

## 2021-09-24 NOTE — Assessment & Plan Note (Signed)
Annual exam as documented.  Counseling done include healthy lifestyle involving committing to 150 minutes of exercise per week, heart healthy diet, and attaining healthy weight. The importance of adequate sleep also discussed.  Regular use of seat belt and home safety were also discussed . Changes in health habits are decided on by patient with goals and time frames set for achieving them. Immunization and cancer screening  needs are specifically addressed at this visit.  Tdap and shingles vaccines given.

## 2021-09-24 NOTE — Assessment & Plan Note (Addendum)
BP Readings from Last 3 Encounters:  09/24/21 (!) 154/96  08/25/20 (!) 154/90  01/07/20 135/89  Chronic uncontrolled condition condition, he has stopped taking lisinopril Patient encouraged to restart lisinopril 20 mg daily DASH diet advised engage in regular vigorous exercises at least 150 minutes weekly Monitor blood pressure daily at home keep a log and bring to next visit in 6 weeks Risk of CKD heart disease due to uncontrolled hypertension discussed

## 2021-09-24 NOTE — Progress Notes (Signed)
Complete physical exam  Patient: Danny Rivers   DOB: January 18, 1971   51 y.o. Male  MRN: [id number]  Subjective:    Chief Complaint  Patient presents with   Annual Exam    cpe    Danny Rivers is a 51 y.o. male who presents today for a complete physical exam. He reports consuming a low fat and low sodium diet.walks 10 miles daily at work , has an exercise bike home for exercises.   He generally feels well. He reports sleeping fairly well. He does have additional problems to discuss today.    Hypertension .  Patient stated that has not been taking lisinopril, the last time he took lisinopril was 1 week ago.  Patient stated that he thought his blood pressure was normal so he stopped taking the medication ,he denies headache, dizziness, edema.   Patient complains of snoring, states that his wife told him that the quits breathing when sleeping  gets about 5 hours of sleep nightly gets up for work at 4 AM.    Most recent fall risk assessment:    09/24/2021    1:07 PM  Sunshine in the past year? 0  Number falls in past yr: 0  Injury with Fall? 0  Risk for fall due to : No Fall Risks  Follow up Falls evaluation completed     Most recent depression screenings:    09/24/2021    1:07 PM 08/25/2020    1:16 PM  PHQ 2/9 Scores  PHQ - 2 Score 0 0        Patient Care Team: Renee Rival, FNP as PCP - General (Nurse Practitioner) Daneil Dolin, MD as Consulting Physician (Gastroenterology)   Outpatient Medications Prior to Visit  Medication Sig   albuterol (VENTOLIN HFA) 108 (90 Base) MCG/ACT inhaler Inhale 2 puffs into the lungs every 6 (six) hours as needed for wheezing or shortness of breath.   meloxicam (MOBIC) 15 MG tablet Take 1 tablet by mouth daily as needed.   [DISCONTINUED] lisinopril (ZESTRIL) 20 MG tablet Take 1 tablet (20 mg total) by mouth daily.   Vitamin D, Ergocalciferol, (DRISDOL) 1.25 MG (50000 UNIT) CAPS capsule Take 1 capsule (50,000 Units  total) by mouth every 7 (seven) days. (Patient not taking: Reported on 09/24/2021)   [DISCONTINUED] rosuvastatin (CRESTOR) 20 MG tablet Take 1 tablet (20 mg total) by mouth daily. (Patient not taking: Reported on 09/24/2021)   No facility-administered medications prior to visit.    Review of Systems  Constitutional: Negative.  Negative for chills, diaphoresis, fever, malaise/fatigue and weight loss.  HENT: Negative.  Negative for congestion, ear discharge, ear pain, hearing loss, nosebleeds, sinus pain and tinnitus.   Eyes: Negative.  Negative for blurred vision, pain, discharge and redness.  Respiratory: Negative.  Negative for cough, hemoptysis, sputum production, shortness of breath, wheezing and stridor.   Cardiovascular: Negative.  Negative for chest pain, palpitations, orthopnea, claudication, leg swelling and PND.  Gastrointestinal: Negative.  Negative for abdominal pain, blood in stool, constipation, diarrhea, heartburn, nausea and vomiting.  Genitourinary: Negative.  Negative for dysuria, frequency and urgency.  Musculoskeletal:  Negative for back pain, falls, myalgias and neck pain.  Skin: Negative.  Negative for itching and rash.  Neurological: Negative.  Negative for dizziness, tingling, tremors and headaches.  Endo/Heme/Allergies: Negative.  Negative for environmental allergies and polydipsia. Does not bruise/bleed easily.  Psychiatric/Behavioral: Negative.  Negative for depression, hallucinations, substance abuse and suicidal ideas. The patient  is not nervous/anxious and does not have insomnia.           Objective:     BP (!) 150/90   Pulse 76   Ht _0  (1.88 m)   Wt 282 lb (127.9 kg)   SpO2 94%   BMI 36.21 kg/m    Physical Exam Vitals and nursing note reviewed.  Constitutional:      General: He is not in acute distress.    Appearance: Normal appearance. He is obese. He is not ill-appearing, toxic-appearing or diaphoretic.  HENT:     Head: Normocephalic and  atraumatic.     Right Ear: Tympanic membrane, ear canal and external ear normal. There is no impacted cerumen.     Left Ear: Tympanic membrane, ear canal and external ear normal. There is no impacted cerumen.     Nose: Nose normal. No congestion or rhinorrhea.     Mouth/Throat:     Mouth: Mucous membranes are moist.     Pharynx: Oropharynx is clear. No oropharyngeal exudate or posterior oropharyngeal erythema.  Eyes:     General: No scleral icterus.       Right eye: No discharge.        Left eye: No discharge.     Extraocular Movements: Extraocular movements intact.     Conjunctiva/sclera: Conjunctivae normal.     Pupils: Pupils are equal, round, and reactive to light.  Neck:     Vascular: No carotid bruit.  Cardiovascular:     Rate and Rhythm: Normal rate and regular rhythm.     Pulses: Normal pulses.     Heart sounds: Normal heart sounds. No murmur heard.    No friction rub. No gallop.  Pulmonary:     Effort: Pulmonary effort is normal. No respiratory distress.     Breath sounds: Normal breath sounds. No stridor. No wheezing, rhonchi or rales.  Chest:     Chest wall: No tenderness.  Abdominal:     General: There is no distension.     Palpations: Abdomen is soft. There is no mass.     Tenderness: There is no abdominal tenderness. There is no right CVA tenderness, left CVA tenderness, guarding or rebound.     Hernia: No hernia is present.  Musculoskeletal:        General: No swelling, tenderness, deformity or signs of injury. Normal range of motion.     Cervical back: Normal range of motion and neck supple. No rigidity or tenderness.     Right lower leg: No edema.     Left lower leg: No edema.  Lymphadenopathy:     Cervical: No cervical adenopathy.  Skin:    General: Skin is warm and dry.     Capillary Refill: Capillary refill takes less than 2 seconds.     Coloration: Skin is not jaundiced or pale.     Findings: No bruising, erythema, lesion or rash.  Neurological:      Mental Status: He is alert and oriented to person, place, and time.     Cranial Nerves: No cranial nerve deficit.     Sensory: No sensory deficit.     Motor: No weakness.     Coordination: Coordination normal.     Gait: Gait normal.     Deep Tendon Reflexes: Reflexes normal.  Psychiatric:        Mood and Affect: Mood normal.        Behavior: Behavior normal.        Thought Content:  Thought content normal.        Judgment: Judgment normal.      No results found for any visits on 09/24/21.     Assessment & Plan:    Routine Health Maintenance and Physical Exam  Immunization History  Administered Date(s) Administered   Influenza,inj,Quad PF,6+ Mos 11/21/2015   Influenza-Unspecified 01/02/2019   Moderna Sars-Covid-2 Vaccination 05/08/2019, 06/05/2019   Tdap 09/24/2021   Zoster Recombinat (Shingrix) 09/24/2021    Health Maintenance  Topic Date Due   COVID-19 Vaccine (3 - Moderna series) 07/31/2019   INFLUENZA VACCINE  10/06/2021   Zoster Vaccines- Shingrix (2 of 2) 11/19/2021   COLONOSCOPY (Pts 45-24yr Insurance coverage will need to be confirmed)  06/12/2025   TETANUS/TDAP  09/25/2031   Hepatitis C Screening  Completed   HIV Screening  Completed   HPV VACCINES  Aged Out    Discussed health benefits of physical activity, and encouraged him to engage in regular exercise appropriate for his age and condition.  Problem List Items Addressed This Visit       Cardiovascular and Mediastinum   Essential hypertension    BP Readings from Last 3 Encounters:  09/24/21 (!) 154/96  08/25/20 (!) 154/90  01/07/20 135/89  Chronic uncontrolled condition condition, he has stopped taking lisinopril Patient encouraged to restart lisinopril 20 mg daily DASH diet advised engage in regular vigorous exercises at least 150 minutes weekly Monitor blood pressure daily at home keep a log and bring to next visit in 6 weeks Risk of CKD heart disease due to uncontrolled hypertension discussed       Relevant Medications   lisinopril (ZESTRIL) 20 MG tablet   rosuvastatin (CRESTOR) 20 MG tablet   Other Relevant Orders   CMP14+EGFR     Other   Obesity    Wt Readings from Last 3 Encounters:  09/24/21 282 lb (127.9 kg)  08/25/20 277 lb (125.6 kg)  01/07/20 260 lb (117.9 kg)  Patient counseled on low-carb diet, encouraged to engage in regular vigorous exercises at least 150 minutes weekly      Mixed hyperlipidemia    He has stopped taking crestor  Restart Crestor 20 mg daily medication refill Avoid fried fatty foods      Relevant Medications   lisinopril (ZESTRIL) 20 MG tablet   rosuvastatin (CRESTOR) 20 MG tablet   Other Relevant Orders   Lipid Profile   Vitamin D deficiency    Currently not on vitamin D supplement Check vitamin D levels today      Relevant Orders   Vitamin D (25 hydroxy)   Annual physical exam - Primary    Annual exam as documented.  Counseling done include healthy lifestyle involving committing to 150 minutes of exercise per week, heart healthy diet, and attaining healthy weight. The importance of adequate sleep also discussed.  Regular use of seat belt and home safety were also discussed . Changes in health habits are decided on by patient with goals and time frames set for achieving them. Immunization and cancer screening  needs are specifically addressed at this visit.  Tdap and shingles vaccines given.       Relevant Orders   HgB A1c   CBC with Differential   TSH   Vitamin D (25 hydroxy)   Snoring    Home sleep study test ordered today Patient encouraged to lose weight      Relevant Orders   Home sleep test   Need for Tdap vaccination    Patient educated on  CDC recommendation for the TDAP vaccine. Verbal consent was obtained from the patient, vaccine administered by nurse, no sign of adverse reactions noted at this time. Patient education on arm soreness and use of tylenol or this patient  was discussed. Patient educated on the signs  and symptoms of adverse effect and advise to contact the office if they occur      Relevant Orders   Tdap vaccine greater than or equal to 7yo IM (Completed)   Need for varicella vaccine    Patient educated on CDC recommendation for the shingles vaccine. Verbal consent was obtained from the patient, vaccine administered by nurse, no sign of adverse reactions noted at this time. Patient education on arm soreness and use of tylenol or this patient  was discussed. Patient educated on the signs and symptoms of adverse effect and advise to contact the office if they occur      Relevant Orders   Varicella-zoster vaccine IM (Completed)   Return in about 6 weeks (around 11/05/2021) for HTN/HLD.     Renee Rival, FNP

## 2021-09-24 NOTE — Assessment & Plan Note (Addendum)
Wt Readings from Last 3 Encounters:  09/24/21 282 lb (127.9 kg)  08/25/20 277 lb (125.6 kg)  01/07/20 260 lb (117.9 kg)  Patient counseled on low-carb diet, encouraged to engage in regular vigorous exercises at least 150 minutes weekly

## 2021-09-24 NOTE — Assessment & Plan Note (Signed)
Patient educated on CDC recommendation for the shingles vaccine. Verbal consent was obtained from the patient, vaccine administered by nurse, no sign of adverse reactions noted at this time. Patient education on arm soreness and use of tylenol or this patient  was discussed. Patient educated on the signs and symptoms of adverse effect and advise to contact the office if they occur

## 2021-09-25 ENCOUNTER — Other Ambulatory Visit: Payer: Self-pay | Admitting: Nurse Practitioner

## 2021-09-25 DIAGNOSIS — E559 Vitamin D deficiency, unspecified: Secondary | ICD-10-CM

## 2021-09-25 DIAGNOSIS — I10 Essential (primary) hypertension: Secondary | ICD-10-CM

## 2021-09-25 LAB — LIPID PANEL
Chol/HDL Ratio: 6.5 ratio — ABNORMAL HIGH (ref 0.0–5.0)
Cholesterol, Total: 240 mg/dL — ABNORMAL HIGH (ref 100–199)
HDL: 37 mg/dL — ABNORMAL LOW (ref >39)
LDL Chol Calc (NIH): 170 mg/dL — ABNORMAL HIGH (ref 0–99)
Triglycerides: 178 mg/dL — ABNORMAL HIGH (ref 0–149)
VLDL Cholesterol Cal: 33 mg/dL (ref 5–40)

## 2021-09-25 LAB — CMP14+EGFR
ALT: 27 IU/L (ref 0–44)
AST: 21 IU/L (ref 0–40)
Albumin/Globulin Ratio: 1.5 (ref 1.2–2.2)
Albumin: 4.4 g/dL (ref 4.1–5.1)
Alkaline Phosphatase: 98 IU/L (ref 44–121)
BUN/Creatinine Ratio: 10 (ref 9–20)
BUN: 11 mg/dL (ref 6–24)
Bilirubin Total: 0.3 mg/dL (ref 0.0–1.2)
CO2: 23 mmol/L (ref 20–29)
Calcium: 9.3 mg/dL (ref 8.7–10.2)
Chloride: 104 mmol/L (ref 96–106)
Creatinine, Ser: 1.09 mg/dL (ref 0.76–1.27)
Globulin, Total: 3 g/dL (ref 1.5–4.5)
Glucose: 91 mg/dL (ref 70–99)
Potassium: 4.2 mmol/L (ref 3.5–5.2)
Sodium: 140 mmol/L (ref 134–144)
Total Protein: 7.4 g/dL (ref 6.0–8.5)
eGFR: 83 mL/min/{1.73_m2} (ref >59)

## 2021-09-25 LAB — CBC WITH DIFFERENTIAL/PLATELET
Basophils Absolute: 0.1 10*3/uL (ref 0.0–0.2)
Basos: 1 %
EOS (ABSOLUTE): 0.1 10*3/uL (ref 0.0–0.4)
Eos: 1 %
Hematocrit: 45.6 % (ref 37.5–51.0)
Hemoglobin: 15.4 g/dL (ref 13.0–17.7)
Immature Grans (Abs): 0 10*3/uL (ref 0.0–0.1)
Immature Granulocytes: 0 %
Lymphocytes Absolute: 2.1 10*3/uL (ref 0.7–3.1)
Lymphs: 27 %
MCH: 28.6 pg (ref 26.6–33.0)
MCHC: 33.8 g/dL (ref 31.5–35.7)
MCV: 85 fL (ref 79–97)
Monocytes Absolute: 0.6 10*3/uL (ref 0.1–0.9)
Monocytes: 8 %
Neutrophils Absolute: 4.9 10*3/uL (ref 1.4–7.0)
Neutrophils: 63 %
Platelets: 234 10*3/uL (ref 150–450)
RBC: 5.38 x10E6/uL (ref 4.14–5.80)
RDW: 12.7 % (ref 11.6–15.4)
WBC: 7.8 10*3/uL (ref 3.4–10.8)

## 2021-09-25 LAB — HEMOGLOBIN A1C
Est. average glucose Bld gHb Est-mCnc: 114 mg/dL
Hgb A1c MFr Bld: 5.6 % (ref 4.8–5.6)

## 2021-09-25 LAB — TSH: TSH: 1.64 u[IU]/mL (ref 0.450–4.500)

## 2021-09-25 LAB — VITAMIN D 25 HYDROXY (VIT D DEFICIENCY, FRACTURES): Vit D, 25-Hydroxy: 18.1 ng/mL — ABNORMAL LOW (ref 30.0–100.0)

## 2021-09-25 MED ORDER — VITAMIN D3 25 MCG (1000 UT) PO CAPS: 1000.0000 [IU] | ORAL_CAPSULE | Freq: Every day | ORAL | 3 refills | Status: AC

## 2021-09-25 NOTE — Progress Notes (Signed)
Hyperlipidemia , pt should take crestor '20mg'$  daily as dicussed Eat a healthy diet, including lots of fruits and vegetables. Avoid foods with a lot of saturated and trans fats, such as red meat, butter, fried foods and cheese . Maintain a healthy weight.   Vitamin D deff. Take vitamin D 1000 units daily  Other labs are normal   Continue current meds and follow up as planned   BMP in 2 weeks

## 2021-11-03 ENCOUNTER — Ambulatory Visit: Payer: BC Managed Care – PPO | Admitting: Internal Medicine

## 2021-11-03 ENCOUNTER — Other Ambulatory Visit: Payer: Self-pay

## 2021-11-03 DIAGNOSIS — G4730 Sleep apnea, unspecified: Secondary | ICD-10-CM

## 2021-11-05 ENCOUNTER — Ambulatory Visit: Payer: BC Managed Care – PPO | Admitting: Nurse Practitioner

## 2021-11-13 ENCOUNTER — Ambulatory Visit: Payer: BC Managed Care – PPO | Admitting: Internal Medicine

## 2021-11-13 ENCOUNTER — Ambulatory Visit: Payer: BC Managed Care – PPO | Admitting: Nurse Practitioner

## 2021-11-26 ENCOUNTER — Encounter: Payer: Self-pay | Admitting: Nurse Practitioner

## 2021-11-26 ENCOUNTER — Ambulatory Visit (INDEPENDENT_AMBULATORY_CARE_PROVIDER_SITE_OTHER): Payer: BC Managed Care – PPO | Admitting: Nurse Practitioner

## 2021-11-26 VITALS — BP 138/76 | HR 82 | Ht 74.0 in | Wt 272.0 lb

## 2021-11-26 DIAGNOSIS — Z23 Encounter for immunization: Secondary | ICD-10-CM

## 2021-11-26 DIAGNOSIS — E782 Mixed hyperlipidemia: Secondary | ICD-10-CM | POA: Diagnosis not present

## 2021-11-26 DIAGNOSIS — I10 Essential (primary) hypertension: Secondary | ICD-10-CM | POA: Diagnosis not present

## 2021-11-26 NOTE — Progress Notes (Signed)
Established Patient Office Visit  Subjective:  Patient ID: Danny Rivers, male    DOB: 08-18-1970  Age: 51 y.o. MRN: [id number]  CC:  Chief Complaint  Patient presents with   Hypertension    F/u   Hyperlipidemia    F/u    HPI Danny Rivers is a 51 y.o. male with past medical history of hypertension, hyperlipidemia, obesity, who presents for follow-up for hypertension and hyperlipidemia   Hypertension currently on lisinopril 20 mg daily patient reports that his blood pressure readings at home has been in the 130s over 80s.  He denies dizziness, chest pain, edema  Hyperlipidemia currently on Crestor 20 mg daily patient denies muscle aches.  Second dose of shingles vaccine given in the office today.    Past Medical History:  Diagnosis Date   Hemorrhoids    History of colonic polyps    Hyperlipidemia    Hypertension    Rectal bleeding 05/26/2015    Past Surgical History:  Procedure Laterality Date   COLONOSCOPY N/A 06/13/2015   Dr.Rourk- tortuous colon, grade III moderate to medium sized hemorrhoids. , 17m polyp in the cecum. bx= benign colonic mucosa   HEMORRHOID BANDING  2017   Dr.Rourk   VASECTOMY     vastectomy      Family History  Problem Relation Age of Onset   Thyroid disease Mother    Diabetes Maternal Uncle    Lung cancer Maternal Grandfather    Colon cancer Neg Hx    Prostate cancer Neg Hx     Social History   Socioeconomic History   Marital status: Married    Spouse name: Cicly    Number of children: 6   Years of education: Not on file   Highest education level: Bachelor's degree (e.g., BA, AB, BS)  Occupational History   Not on file  Tobacco Use   Smoking status: Never   Smokeless tobacco: Never   Tobacco comments:    never smoked  Vaping Use   Vaping Use: Not on file  Substance and Sexual Activity   Alcohol use: No    Alcohol/week: 0.0 standard drinks of alcohol   Drug use: No   Sexual activity: Yes    Partners: Female     Birth control/protection: Surgical  Other Topics Concern   Not on file  Social History Narrative   Lives with wife Cicly    6 children-3 with wife which live with him.   2 children are grown and the other is 145and lives with mother.      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 8 g 0   Cholecalciferol (VITAMIN D3) 25 MCG (1000 UT) CAPS Take 1 capsule (1,000 Units total) by mouth daily. 60 capsule 3   lisinopril (ZESTRIL) 20 MG tablet Take 1 tablet (20 mg total) by mouth daily. 90 tablet 1   meloxicam (MOBIC) 15 MG tablet Take 1 tablet by mouth daily as needed.     rosuvastatin (CRESTOR) 20 MG tablet Take 1 tablet (20 mg total) by mouth daily. 90 tablet 3   No facility-administered medications prior to visit.    No Known Allergies  ROS Review of Systems  Constitutional: Negative.  Negative for activity change, appetite change and chills.  HENT: Negative.  Negative for congestion, dental problem and drooling.   Respiratory:  Negative for apnea, cough, shortness of breath and wheezing.   Cardiovascular: Negative.  Negative for chest pain, palpitations and leg swelling.  Gastrointestinal: Negative.  Negative for abdominal distention, abdominal pain and  anal bleeding.  Musculoskeletal: Negative.  Negative for arthralgias, back pain and gait problem.  Neurological:  Negative for dizziness, facial asymmetry, light-headedness and headaches.  Psychiatric/Behavioral: Negative.  Negative for agitation, behavioral problems and confusion.       Objective:    Physical Exam Constitutional:      General: He is not in acute distress.    Appearance: He is obese. He is not ill-appearing, toxic-appearing or diaphoretic.  Cardiovascular:     Rate and Rhythm: Normal rate and regular rhythm.     Pulses: Normal pulses.     Heart sounds: Normal heart sounds. No murmur heard.    No friction rub. No gallop.  Pulmonary:     Effort: Pulmonary effort is normal. No respiratory distress.     Breath sounds: Normal breath sounds. No stridor. No wheezing, rhonchi or rales.  Chest:     Chest wall: No tenderness.  Musculoskeletal:        General: No swelling, tenderness, deformity or signs of injury. Normal range of motion.     Right lower leg: No edema.     Left lower leg: No edema.  Skin:    General: Skin is warm and dry.     Capillary Refill: Capillary refill takes less than 2 seconds.     Coloration: Skin is not jaundiced or pale.     Findings: No bruising or erythema.  Neurological:     Mental Status: He is alert and oriented to person, place, and time.     Cranial Nerves: No cranial nerve deficit.     Sensory: No sensory deficit.     Motor: No weakness.     Gait: Gait normal.  Psychiatric:        Mood and Affect: Mood normal.        Behavior: Behavior normal.        Thought Content: Thought content normal.        Judgment: Judgment normal.     BP 138/76 (BP Location: Left Arm, Cuff Size: Large)   Pulse 82   Ht _0  (1.88 m)   Wt 272 lb (123.4 kg)   SpO2 96%   BMI 34.92 kg/m  Wt Readings from Last 3 Encounters:  11/26/21 272 lb (123.4 kg)  09/24/21 282 lb (127.9 kg)  08/25/20 277 lb (125.6 kg)    Lab Results  Component Value Date    TSH 1.640 09/24/2021   Lab Results  Component Value Date   WBC 7.8 09/24/2021   HGB 15.4 09/24/2021   HCT  45.6 09/24/2021   MCV 85 09/24/2021   PLT 234 09/24/2021   Lab Results  Component Value Date   NA 140 09/24/2021   K 4.2 09/24/2021   CO2 23 09/24/2021   GLUCOSE 91 09/24/2021   BUN 11 09/24/2021   CREATININE 1.09 09/24/2021   BILITOT 0.3 09/24/2021   ALKPHOS 98 09/24/2021   AST 21 09/24/2021   ALT 27 09/24/2021   PROT 7.4 09/24/2021   ALBUMIN 4.4 09/24/2021   CALCIUM 9.3 09/24/2021   ANIONGAP 8 01/07/2020   EGFR 83 09/24/2021   Lab Results  Component Value Date   CHOL 240 (H) 09/24/2021   Lab Results  Component Value Date   HDL 37 (L) 09/24/2021   Lab Results  Component Value Date   LDLCALC 170 (H) 09/24/2021   Lab Results  Component Value Date   TRIG 178 (H) 09/24/2021   Lab Results  Component Value Date   CHOLHDL 6.5 (H) 09/24/2021   Lab Results  Component Value Date   HGBA1C 5.6 09/24/2021      Assessment & Plan:   Problem List Items Addressed This Visit       Cardiovascular and Mediastinum   Essential hypertension - Primary    BP Readings from Last 3 Encounters:  11/26/21 138/76  09/24/21 (!) 150/90  08/25/20 (!) 154/90  Chronic condition well-controlled on lisinopril 20 mg daily Continue current medication DASH diet advised engage in regular moderate to vigorous exercises at least 150 minutes weekly Follow-up in 6 months      Relevant Orders   Basic Metabolic Panel (BMET)     Other   Obesity    Wt Readings from Last 3 Encounters:  11/26/21 272 lb (123.4 kg)  09/24/21 282 lb (127.9 kg)  08/25/20 277 lb (125.6 kg)  paying more attenbtion to what he eats , has beeen excecrcising more .  Patient congratulated on losing 10 pounds since last visit He was encouraged to intensify his efforts by engaging in regular moderate to vigorous exercises at least 150 minutes weekly, daily intake of whole food consisting mainly vegetables  and protein less carbohydrate.  Portion control also encouraged      Mixed hyperlipidemia    Lab Results  Component Value Date   CHOL 240 (H) 09/24/2021   HDL 37 (L) 09/24/2021   LDLCALC 170 (H) 09/24/2021   TRIG 178 (H) 09/24/2021   CHOLHDL 6.5 (H) 09/24/2021  The 10-year ASCVD risk score (Arnett DK, et al., 2019) is: 11.6%   Values used to calculate the score:     Age: 28 years     Sex: Male     Is Non-Hispanic African American: Yes     Diabetic: No     Tobacco smoker: No     Systolic Blood Pressure: 401 mmHg     Is BP treated: Yes     HDL Cholesterol: 37 mg/dL     Total Cholesterol: 240 mg/dL  Doing well on Crestor 20 mg daily Fasting lipid panel ordered LDL goal is less than 100      Relevant Orders   Lipid Profile   Need for varicella vaccine    .Patient educated on CDC recommendation for the varicella vaccine. Verbal consent was obtained from the patient, vaccine administered by nurse, no sign of adverse reactions noted at this time. Patient education on arm soreness and use of tylenol  for this patient  was discussed. Patient educated on the signs and symptoms of adverse effect and advise  to contact the office if they occur. Vaccine information sheet given to patient.       Relevant Orders   Varicella-zoster vaccine IM    No orders of the defined types were placed in this encounter.   Follow-up: Return in about 6 months (around 05/27/2022) for HTN/HLD.    Renee Rival, FNP

## 2021-11-26 NOTE — Assessment & Plan Note (Addendum)
Wt Readings from Last 3 Encounters:  11/26/21 272 lb (123.4 kg)  09/24/21 282 lb (127.9 kg)  08/25/20 277 lb (125.6 kg)  paying more attenbtion to what he eats , has beeen excecrcising more .  Patient congratulated on losing 10 pounds since last visit He was encouraged to intensify his efforts by engaging in regular moderate to vigorous exercises at least 150 minutes weekly, daily intake of whole food consisting mainly vegetables and protein less carbohydrate.  Portion control also encouraged

## 2021-11-26 NOTE — Assessment & Plan Note (Signed)
.  Patient educated on CDC recommendation for the varicella vaccine. Verbal consent was obtained from the patient, vaccine administered by nurse, no sign of adverse reactions noted at this time. Patient education on arm soreness and use of tylenol  for this patient  was discussed. Patient educated on the signs and symptoms of adverse effect and advise to contact the office if they occur. Vaccine information sheet given to patient.

## 2021-11-26 NOTE — Assessment & Plan Note (Addendum)
Lab Results  Component Value Date   CHOL 240 (H) 09/24/2021   HDL 37 (L) 09/24/2021   LDLCALC 170 (H) 09/24/2021   TRIG 178 (H) 09/24/2021   CHOLHDL 6.5 (H) 09/24/2021  The 10-year ASCVD risk score (Arnett DK, et al., 2019) is: 11.6%   Values used to calculate the score:     Age: 51 years     Sex: Male     Is Non-Hispanic African American: Yes     Diabetic: No     Tobacco smoker: No     Systolic Blood Pressure: 539 mmHg     Is BP treated: Yes     HDL Cholesterol: 37 mg/dL     Total Cholesterol: 240 mg/dL  Doing well on Crestor 20 mg daily Fasting lipid panel ordered LDL goal is less than 100

## 2021-11-26 NOTE — Patient Instructions (Signed)

## 2021-11-26 NOTE — Assessment & Plan Note (Signed)
BP Readings from Last 3 Encounters:  11/26/21 138/76  09/24/21 (!) 150/90  08/25/20 (!) 154/90  Chronic condition well-controlled on lisinopril 20 mg daily Continue current medication DASH diet advised engage in regular moderate to vigorous exercises at least 150 minutes weekly Follow-up in 6 months

## 2021-12-09 ENCOUNTER — Ambulatory Visit: Payer: BC Managed Care – PPO | Admitting: Internal Medicine

## 2021-12-09 ENCOUNTER — Encounter: Payer: Self-pay | Admitting: Internal Medicine

## 2021-12-09 VITALS — BP 137/86 | HR 81 | Ht 74.0 in | Wt 265.2 lb

## 2021-12-09 DIAGNOSIS — M6289 Other specified disorders of muscle: Secondary | ICD-10-CM | POA: Diagnosis not present

## 2021-12-09 MED ORDER — CYCLOBENZAPRINE HCL 5 MG PO TABS: 5.0000 mg | ORAL_TABLET | Freq: Three times a day (TID) | ORAL | 0 refills | Status: AC | PRN

## 2021-12-09 NOTE — Patient Instructions (Signed)
It was a pleasure to see you today.  Thank you for giving [place] the opportunity to be involved in your care.  Below is a brief recap of your visit and next steps.  We will plan to see you again in March  Summary I recommend continuing to take antiinflammatories as needed for pain relief.  I have prescribed flexeril to help with muscle spasms    Next steps Follow up is scheduled for March. Please return to care if your pain is not improving by next week.
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
## 2021-12-24 DIAGNOSIS — M25562 Pain in left knee: Secondary | ICD-10-CM | POA: Diagnosis not present

## 2021-12-31 ENCOUNTER — Other Ambulatory Visit: Payer: Self-pay

## 2021-12-31 DIAGNOSIS — I10 Essential (primary) hypertension: Secondary | ICD-10-CM

## 2021-12-31 MED ORDER — LISINOPRIL 20 MG PO TABS: 20.0000 mg | ORAL_TABLET | Freq: Every day | ORAL | 1 refills | Status: DC

## 2022-01-04 ENCOUNTER — Other Ambulatory Visit: Payer: Self-pay

## 2022-01-04 DIAGNOSIS — E782 Mixed hyperlipidemia: Secondary | ICD-10-CM

## 2022-01-04 DIAGNOSIS — I10 Essential (primary) hypertension: Secondary | ICD-10-CM

## 2022-01-04 MED ORDER — ROSUVASTATIN CALCIUM 20 MG PO TABS: 20.0000 mg | ORAL_TABLET | Freq: Every day | ORAL | 3 refills | Status: DC

## 2022-01-04 MED ORDER — LISINOPRIL 20 MG PO TABS: 20.0000 mg | ORAL_TABLET | Freq: Every day | ORAL | 1 refills | Status: DC

## 2022-01-11 ENCOUNTER — Ambulatory Visit: Payer: BC Managed Care – PPO | Admitting: Internal Medicine

## 2022-01-15 ENCOUNTER — Encounter: Payer: Self-pay | Admitting: Internal Medicine

## 2022-01-15 ENCOUNTER — Ambulatory Visit: Payer: BC Managed Care – PPO | Admitting: Internal Medicine

## 2022-01-15 VITALS — BP 119/73 | HR 77 | Ht 74.0 in | Wt 266.6 lb

## 2022-01-15 DIAGNOSIS — L918 Other hypertrophic disorders of the skin: Secondary | ICD-10-CM | POA: Diagnosis not present

## 2022-01-15 NOTE — Patient Instructions (Signed)
It was a pleasure to see you today.  Thank you for giving [place] the opportunity to be involved in your care.  Below is a brief recap of your visit and next steps.  We will plan to see you again in March  Summary You have a skin tag under your right armpit I recommend purchasing Dr. Felicie Morn skin tag remover. This can be found at your pharmacy or purchased on [employer].

## 2022-01-15 NOTE — Assessment & Plan Note (Signed)
He presents today for evaluation of a lesion in his right axilla.  On exam the lesion appears most consistent with a skin tag.  I recommended that he purchase Dr. Felicie Morn skin tag remover at his pharmacy. -He will follow up as needed if the Dr. Felicie Morn kit is unsuccessful in removing the skin tag, otherwise he has routine follow up scheduled for March.

## 2022-01-15 NOTE — Progress Notes (Signed)
   Acute Office Visit  Subjective:     Patient ID: Danny Rivers, male    DOB: 1970-06-16, 51 y.o.   MRN: [id number]  Chief Complaint  Patient presents with   Skin Tag   Danny Rivers presents today for evaluation of a skin lesion in his right axilla.  He reports noticing a lesion in his right axilla approximately 1 year ago.  It periodically becomes inflamed and seems to have enlarged recently.  The lesion does not bleed or have any discharge, and is not painful.  He would like to know what it is and what his treatment options are.  Danny Rivers is otherwise asymptomatic without acute concerns.  Review of Systems  Skin:        Skin lesion in right axilla  All other systems reviewed and are negative.     Objective:    BP 119/73   Pulse 77   Ht _0  (1.88 m)   Wt 266 lb 9.6 oz (120.9 kg)   SpO2 96%   BMI 34.23 kg/m   Physical Exam Skin:    General: Skin is warm and dry.     Findings: Lesion (There is a pedunculated homogenous, nontender, soft in the right axilla that is most with a skin tag.) present.       Assessment & Plan:   Problem List Items Addressed This Visit       Skin tag - Primary    He presents today for evaluation of a lesion in his right axilla.  On exam the lesion appears most consistent with a skin tag.  I recommended that he purchase Dr. Felicie Morn skin tag remover at his pharmacy. -He will follow up as needed if the Dr. Felicie Morn kit is unsuccessful in removing the skin tag, otherwise he has routine follow up scheduled for March.        Return if symptoms worsen or fail to improve.  Johnette Abraham, MD

## 2022-05-10 ENCOUNTER — Other Ambulatory Visit: Payer: Self-pay | Admitting: Internal Medicine

## 2022-05-10 DIAGNOSIS — I10 Essential (primary) hypertension: Secondary | ICD-10-CM

## 2022-05-28 ENCOUNTER — Ambulatory Visit: Payer: BC Managed Care – PPO | Admitting: Internal Medicine

## 2022-12-16 ENCOUNTER — Encounter: Payer: BC Managed Care – PPO | Admitting: Internal Medicine

## 2023-02-11 ENCOUNTER — Encounter: Payer: BC Managed Care – PPO | Admitting: Internal Medicine

## 2023-02-17 ENCOUNTER — Encounter: Payer: Self-pay | Admitting: Internal Medicine

## 2023-07-11 ENCOUNTER — Ambulatory Visit: Admitting: Internal Medicine

## 2023-07-19 DIAGNOSIS — Z6835 Body mass index (BMI) 35.0-35.9, adult: Secondary | ICD-10-CM | POA: Diagnosis not present

## 2023-07-19 DIAGNOSIS — Z713 Dietary counseling and surveillance: Secondary | ICD-10-CM | POA: Diagnosis not present

## 2023-07-25 ENCOUNTER — Encounter: Payer: Self-pay | Admitting: Internal Medicine

## 2023-09-05 DIAGNOSIS — Z6835 Body mass index (BMI) 35.0-35.9, adult: Secondary | ICD-10-CM | POA: Diagnosis not present

## 2023-09-05 DIAGNOSIS — Z713 Dietary counseling and surveillance: Secondary | ICD-10-CM | POA: Diagnosis not present

## 2023-09-26 DIAGNOSIS — Z713 Dietary counseling and surveillance: Secondary | ICD-10-CM | POA: Diagnosis not present

## 2023-09-26 DIAGNOSIS — Z6835 Body mass index (BMI) 35.0-35.9, adult: Secondary | ICD-10-CM | POA: Diagnosis not present

## 2023-10-18 ENCOUNTER — Other Ambulatory Visit: Payer: Self-pay

## 2023-10-18 ENCOUNTER — Emergency Department (HOSPITAL_COMMUNITY)

## 2023-10-18 ENCOUNTER — Ambulatory Visit: Payer: Self-pay

## 2023-10-18 ENCOUNTER — Emergency Department (HOSPITAL_COMMUNITY)
Admission: EM | Admit: 2023-10-18 | Discharge: 2023-10-18 | Disposition: A | Discharge status: Home / Self Care | Attending: Emergency Medicine | Admitting: Emergency Medicine

## 2023-10-18 DIAGNOSIS — R079 Chest pain, unspecified: Secondary | ICD-10-CM | POA: Diagnosis not present

## 2023-10-18 DIAGNOSIS — R0989 Other specified symptoms and signs involving the circulatory and respiratory systems: Secondary | ICD-10-CM | POA: Diagnosis not present

## 2023-10-18 DIAGNOSIS — I10 Essential (primary) hypertension: Secondary | ICD-10-CM | POA: Insufficient documentation

## 2023-10-18 DIAGNOSIS — R0789 Other chest pain: Secondary | ICD-10-CM | POA: Insufficient documentation

## 2023-10-18 DIAGNOSIS — Z79899 Other long term (current) drug therapy: Secondary | ICD-10-CM | POA: Diagnosis not present

## 2023-10-18 DIAGNOSIS — R600 Localized edema: Secondary | ICD-10-CM | POA: Insufficient documentation

## 2023-10-18 LAB — CBC WITH DIFFERENTIAL/PLATELET
Abs Immature Granulocytes: 0.02 K/uL (ref 0.00–0.07)
Basophils Absolute: 0.1 K/uL (ref 0.0–0.1)
Basophils Relative: 1 %
Eosinophils Absolute: 0.1 K/uL (ref 0.0–0.5)
Eosinophils Relative: 2 %
HCT: 47.1 % (ref 39.0–52.0)
Hemoglobin: 15.8 g/dL (ref 13.0–17.0)
Immature Granulocytes: 0 %
Lymphocytes Relative: 29 %
Lymphs Abs: 2.2 K/uL (ref 0.7–4.0)
MCH: 29.4 pg (ref 26.0–34.0)
MCHC: 33.5 g/dL (ref 30.0–36.0)
MCV: 87.5 fL (ref 80.0–100.0)
Monocytes Absolute: 0.9 K/uL (ref 0.1–1.0)
Monocytes Relative: 11 %
Neutro Abs: 4.3 K/uL (ref 1.7–7.7)
Neutrophils Relative %: 57 %
Platelets: 250 K/uL (ref 150–400)
RBC: 5.38 MIL/uL (ref 4.22–5.81)
RDW: 12.4 % (ref 11.5–15.5)
WBC: 7.5 K/uL (ref 4.0–10.5)
nRBC: 0 % (ref 0.0–0.2)

## 2023-10-18 LAB — COMPREHENSIVE METABOLIC PANEL WITH GFR
ALT: 26 U/L (ref 0–44)
AST: 15 U/L (ref 15–41)
Albumin: 4 g/dL (ref 3.5–5.0)
Alkaline Phosphatase: 100 U/L (ref 38–126)
Anion gap: 10 (ref 5–15)
BUN: 13 mg/dL (ref 6–20)
CO2: 24 mmol/L (ref 22–32)
Calcium: 9.1 mg/dL (ref 8.9–10.3)
Chloride: 103 mmol/L (ref 98–111)
Creatinine, Ser: 0.98 mg/dL (ref 0.61–1.24)
GFR, Estimated: >60 mL/min (ref >60)
Glucose, Bld: 85 mg/dL (ref 70–99)
Potassium: 3.8 mmol/L (ref 3.5–5.1)
Sodium: 137 mmol/L (ref 135–145)
Total Bilirubin: 0.9 mg/dL (ref 0.0–1.2)
Total Protein: 7.8 g/dL (ref 6.5–8.1)

## 2023-10-18 LAB — TROPONIN I (HIGH SENSITIVITY)
Troponin I (High Sensitivity): 4 ng/L (ref <18)
Troponin I (High Sensitivity): 5 ng/L (ref <18)

## 2023-10-18 LAB — LIPASE, BLOOD: Lipase: 42 U/L (ref 11–51)

## 2023-10-18 MED ORDER — ASPIRIN 81 MG PO CHEW
324.0000 mg | CHEWABLE_TABLET | Freq: Once | ORAL | Status: AC
  Administered 2023-10-18 (×2): 324 mg via ORAL
  Filled 2023-10-18: qty 4

## 2023-10-18 NOTE — ED Provider Notes (Signed)
 [place] EMERGENCY DEPARTMENT AT [facility] Provider Note   CSN: [id number] Arrival date & time: 10/18/23  1558     Patient presents with: Chest Pain   Danny Rivers is a 53 y.o. male.    Chest Pain This patient is a 53 year old male with a history of hypertension on lisinopril , high cholesterol on rosuvastatin , he does not smoke or drink, he presents to the hospital with a complaint of chest discomfort going on for the last couple of days located in the left side of his neck and jaw and in his upper chest, seems to be worse with taking a breath and laying down flat and not associated with exertion shortness of breath nausea or diaphoresis.  He does have mild lower extremity edema which is chronic and seems to get worse as the day goes on, this is not new today.  Because of his discomfort in his chest he decided to come get checked out.  He has no prior history of coronary disease but has not had any further workups in the past  I have reviewed the medical record, the patient is followed in the office by his family doctor, he is treated for hypertension but states he is not perfectly compliant with his medicines missing them several times a week, no heart catheterizations or echocardiograms on record, no stress test on record either.     Prior to Admission medications   Medication Sig Start Date End Date Taking? Authorizing Provider  albuterol  (VENTOLIN  HFA) 108 (90 Base) MCG/ACT inhaler Inhale 1 puff into the lungs every 6 (six) hours as needed for wheezing or shortness of breath. 05/26/23   [provider]  Cholecalciferol (VITAMIN D3) 25 MCG (1000 UT) CAPS Take 1 capsule (1,000 Units total) by mouth daily. 09/25/21   Paseda, Folashade R, FNP  lisinopril  (ZESTRIL ) 20 MG tablet TAKE 1 TABLET BY MOUTH DAILY 05/11/22   Dixon, Phillip E, MD  meloxicam (MOBIC) 15 MG tablet Take 1 tablet by mouth daily as needed. 09/22/21   [provider]  rosuvastatin  (CRESTOR )  20 MG tablet Take 1 tablet (20 mg total) by mouth daily. 01/04/22   Dixon, Phillip E, MD    Allergies: Patient has no known allergies.    Review of Systems  Cardiovascular:  Positive for chest pain.  All other systems reviewed and are negative.   Updated Vital Signs BP 125/80   Pulse 66   Temp 98 F (36.7 C) (Oral)   Resp 16   Ht 1.88 m (6' 2)   Wt 120.9 kg   SpO2 98%   BMI 34.22 kg/m   Physical Exam Vitals and nursing note reviewed.  Constitutional:      General: He is not in acute distress.    Appearance: He is well-developed.  HENT:     Head: Normocephalic and atraumatic.     Mouth/Throat:     Pharynx: No oropharyngeal exudate.  Eyes:     General: No scleral icterus.       Right eye: No discharge.        Left eye: No discharge.     Conjunctiva/sclera: Conjunctivae normal.     Pupils: Pupils are equal, round, and reactive to light.  Neck:     Thyroid: No thyromegaly.     Vascular: No JVD.  Cardiovascular:     Rate and Rhythm: Normal rate and regular rhythm.     Heart sounds: Normal heart sounds. No murmur heard.    No  friction rub. No gallop.  Pulmonary:     Effort: Pulmonary effort is normal. No respiratory distress.     Breath sounds: Normal breath sounds. No wheezing or rales.  Abdominal:     General: Bowel sounds are normal. There is no distension.     Palpations: Abdomen is soft. There is no mass.     Tenderness: There is no abdominal tenderness.  Musculoskeletal:        General: No tenderness. Normal range of motion.     Cervical back: Normal range of motion and neck supple.     Right lower leg: Edema present.     Left lower leg: Edema present.     Comments: Symmetrical scant pitting edema to the bilateral lower extremities in the pretibial region  Lymphadenopathy:     Cervical: No cervical adenopathy.  Skin:    General: Skin is warm and dry.     Findings: No erythema or rash.  Neurological:     Mental Status: He is alert.     Coordination:  Coordination normal.  Psychiatric:        Behavior: Behavior normal.     (all labs ordered are listed, but only abnormal results are displayed) Labs Reviewed  CBC WITH DIFFERENTIAL/PLATELET  COMPREHENSIVE METABOLIC PANEL WITH GFR  LIPASE, BLOOD  TROPONIN I (HIGH SENSITIVITY)  TROPONIN I (HIGH SENSITIVITY)    EKG: EKG Interpretation Date/Time:  Tuesday October 18 2023 16:12:45 EDT Ventricular Rate:  74 PR Interval:  164 QRS Duration:  89 QT Interval:  386 QTC Calculation: 429 R Axis:   69  Text Interpretation: Sinus rhythm Probable LVH with secondary repol abnrm unchanged Confirmed by Cleotilde Rogue ([id number]) on 10/18/2023 4:21:48 PM  Radiology: [facility] Chest Port 1 View Result Date: 10/18/2023 CLINICAL DATA:  Chest pain EXAM: PORTABLE CHEST 1 VIEW COMPARISON:  03/03/2012 FINDINGS: Low lung volumes. Heart and mediastinal contours are within normal limits. No focal opacities or effusions. No acute bony abnormality. IMPRESSION: Low volumes.  No active cardiopulmonary disease. Electronically Signed   By: Franky Crease M.D.   On: 10/18/2023 16:29     Procedures   Medications Ordered in the ED  aspirin  chewable tablet 324 mg (324 mg Oral Given 10/18/23 1649)                                    Medical Decision Making Amount and/or Complexity of Data Reviewed Labs: ordered. Radiology: ordered.  Risk OTC drugs.   EKG shows no signs of arrhythmia, no ST elevation however he does have evidence of left ventricular hypertrophy by voltage criteria in the precordial leads with T wave inversions in the inferior and lateral precordial leads.   This patient presents to the ED for concern of chest pain, this involves an extensive number of treatment options, and is a complaint that carries with it a high risk of complications and morbidity.  The differential diagnosis includes ACS, less likely be PE, could be acid reflux or pancreatitis, does not have any exertional symptoms, he is not hypoxic  nor is he tachycardic and has no asymmetry of his legs recent surgery recent travel or other exposures for things that would cause DVT.   Co morbidities / Chronic conditions that complicate the patient evaluation  Hypertension, somewhat compliant   Additional history obtained:  Additional history obtained from EMR External records from outside source obtained and reviewed including medical record including office  visits   Lab Tests:  I Ordered, and personally interpreted labs.  The pertinent results include: Normal troponin, repeat troponin with no delta change, CBC unremarkable, metabolic panel without acute findings   Imaging Studies ordered:  I ordered imaging studies including chest x-ray I independently visualized and interpreted imaging which showed other than low volumes no other acute disease I agree with the radiologist interpretation   Cardiac Monitoring: / EKG:  The patient was maintained on a cardiac monitor.  I personally viewed and interpreted the cardiac monitored which showed an underlying rhythm of: EKG unremarkable, normal sinus rhythm, repolarization abnormalities consistent with possible history of hypertension   Problem List / ED Course / Critical interventions / Medication management  Patient informed of abnormal EKG findings, normal troponins, atypical type chest pain, can follow-up outpatient I ordered medication including aspirin  Reevaluation of the patient after these medicines showed that the patient improved I have reviewed the patients home medicines and have made adjustments as needed   Social Determinants of Health:  Continue to follow-up outpatient   Test / Admission - Considered:  Insert admission but no acute findings, patient stable for discharge      Final diagnoses:  Chest pain, unspecified type    ED Discharge Orders     None          Cleotilde Rogue, MD 10/18/23 1943

## 2023-10-18 NOTE — Telephone Encounter (Signed)
 FYI Only or Action Required?: FYI only for provider.  Patient was last seen in primary care on 01/15/2022 by Melvenia Manus BRAVO, MD.  Called Nurse Triage reporting Chest Pain.  Symptoms began several days ago.  Interventions attempted: Nothing.  Symptoms are: gradually worsening.  Triage Disposition: Go to ED Now (Notify PCP)  Patient/caregiver understands and will follow disposition?: yes  Copied from CRM #[id number]. Topic: Clinical - Red Word Triage >> Oct 18, 2023  3:35 PM Rachelle R wrote: Red Word that prompted transfer to Nurse Triage: Patient has been having a funny feeling in his chest over the last few days. Not necessarily pain but a feeling of emptiness almost resembles a hunger pain/heartburn in his chest. Reason for Disposition  Pain also in shoulder(s) or arm(s) or jaw  (Exception: Pain is clearly made worse by movement.)  Answer Assessment - Initial Assessment Questions 1. LOCATION: Where does it hurt?       chest 2. RADIATION: Does the pain go anywhere else? (e.g., into neck, jaw, arms, back)     Chest and shoulders 3. ONSET: When did the chest pain begin? (Minutes, hours or days)      Couple days 4. PATTERN: Does the pain come and go, or has it been constant since it started?  Does it get worse with exertion?      intermittent 5. DURATION: How long does it last (e.g., seconds, minutes, hours)     Few seconds 6. SEVERITY: How bad is the pain?  (e.g., Scale 1-10; mild, moderate, or severe)     mild 7. CARDIAC RISK FACTORS: Do you have any history of heart problems or risk factors for heart disease? (e.g., angina, prior heart attack; diabetes, high blood pressure, high cholesterol, smoker, or strong family history of heart disease)     HTN, LDL 8. PULMONARY RISK FACTORS: Do you have any history of lung disease?  (e.g., blood clots in lung, asthma, emphysema, birth control pills)     denies 9. CAUSE: What do you think is causing the chest pain?      unsure 10. OTHER SYMPTOMS: Do you have any other symptoms? (e.g., dizziness, nausea, vomiting, sweating, fever, difficulty breathing, cough)       Dizziness and lightheadedness  Denies palpitation. States that in the beginning he had pain in L arm. Denies N/V.  Protocols used: Chest Pain-A-AH

## 2023-10-18 NOTE — Discharge Instructions (Signed)
 Your testing today has been normal, there is no signs of heart attack at all, I do want you to follow-up with your doctor within the next week especially if you are continuing to have pain, until that time take a baby aspirin  daily, if you do develop severe or worsening symptoms return to the ER immediately.

## 2023-10-18 NOTE — ED Triage Notes (Signed)
 Pt c/o chest tightness that sometimes radiates to neck and arm x 2 days with sob.

## 2023-10-18 NOTE — ED Notes (Signed)
 AVS provided by edp was reviewed by EDP at bedside. Pt was able to verbalize understanding of discharge instructions to this RN. Pt and pts s/o at bedside deny any additional questions.

## 2023-10-19 ENCOUNTER — Telehealth: Payer: Self-pay

## 2023-10-19 NOTE — Telephone Encounter (Signed)
 Copied from CRM [id number]. Topic: Appointments - Appointment Scheduling >> Oct 19, 2023  8:37 AM Turkey B wrote: Patient called in, for hosp fu appt, nothing available with Dr Zarwolo until December. Please cb for further assistance

## 2023-10-19 NOTE — Telephone Encounter (Signed)
 Patient to be scheduled Mon 8/18 at 10 am

## 2023-10-24 ENCOUNTER — Ambulatory Visit

## 2023-10-24 VITALS — BP 125/81 | HR 60 | Ht 74.0 in | Wt 282.1 lb

## 2023-10-24 DIAGNOSIS — R079 Chest pain, unspecified: Secondary | ICD-10-CM

## 2023-10-24 DIAGNOSIS — I10 Essential (primary) hypertension: Secondary | ICD-10-CM

## 2023-10-24 DIAGNOSIS — E782 Mixed hyperlipidemia: Secondary | ICD-10-CM

## 2023-10-24 DIAGNOSIS — G8929 Other chronic pain: Secondary | ICD-10-CM

## 2023-10-24 DIAGNOSIS — R9431 Abnormal electrocardiogram [ECG] [EKG]: Secondary | ICD-10-CM | POA: Diagnosis not present

## 2023-10-24 DIAGNOSIS — M25562 Pain in left knee: Secondary | ICD-10-CM

## 2023-10-24 MED ORDER — MELOXICAM 15 MG PO TABS: 15.0000 mg | ORAL_TABLET | Freq: Every day | ORAL | 3 refills | Status: AC | PRN

## 2023-10-24 MED ORDER — ROSUVASTATIN CALCIUM 20 MG PO TABS: 20.0000 mg | ORAL_TABLET | Freq: Every day | ORAL | 3 refills | Status: AC

## 2023-10-24 MED ORDER — LISINOPRIL 20 MG PO TABS: 20.0000 mg | ORAL_TABLET | Freq: Every day | ORAL | 3 refills | Status: AC

## 2023-10-24 MED ORDER — FAMOTIDINE 40 MG PO TABS: 40.0000 mg | ORAL_TABLET | Freq: Every day | ORAL | 1 refills | Status: AC
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
## 2023-10-24 NOTE — Assessment & Plan Note (Addendum)
 Stable with Crestor  20 mg.  We will update lipid panel in 6 months.  Recent CMP was normal.

## 2023-10-24 NOTE — Assessment & Plan Note (Signed)
 ER evaluation was negative for acute coronary syndrome.  Chest pain may be related to acid reflux at bedtime.  Will add Pepcid  40 mg at bedtime to see if any improvement.

## 2023-10-24 NOTE — Assessment & Plan Note (Signed)
 Chronic condition well-controlled on lisinopril  20 mg daily Continue current medication DASH diet advised engage in regular moderate to vigorous exercises at least 150 minutes weekly Follow-up in 6 months

## 2023-10-24 NOTE — Assessment & Plan Note (Signed)
 Meloxicam  refilled for as needed relief of knee pain.

## 2023-10-24 NOTE — Assessment & Plan Note (Signed)
 Will obtain echocardiogram for further evaluation of abnormality seen on recent EKG in the ER.

## 2023-10-24 NOTE — Patient Instructions (Signed)
 Someone will call to schedule Echocardiogram within next 1-2 days.   Pepcid  sent to mail order pharmacy to help with night time chest pain, as I suspect this may be due to acid reflux.

## 2023-11-04 ENCOUNTER — Ambulatory Visit (HOSPITAL_COMMUNITY)
Admission: RE | Admit: 2023-11-04 | Discharge: 2023-11-04 | Disposition: A | Discharge status: Home / Self Care | Source: Ambulatory Visit

## 2023-11-04 DIAGNOSIS — R9431 Abnormal electrocardiogram [ECG] [EKG]: Secondary | ICD-10-CM | POA: Diagnosis not present

## 2023-11-04 LAB — ECHOCARDIOGRAM COMPLETE
AR max vel: 2.67 cm2
AV Area VTI: 3.15 cm2
AV Area mean vel: 3.01 cm2
AV Mean grad: 4 mmHg
AV Peak grad: 9.1 mmHg
Ao pk vel: 1.51 m/s
Area-P 1/2: 3.27 cm2
S' Lateral: 2.9 cm

## 2023-11-04 NOTE — Progress Notes (Signed)
*  PRELIMINARY RESULTS* Echocardiogram 2D Echocardiogram has been performed.  Danny Rivers 11/04/2023, 10:17 AM

## 2024-04-25 ENCOUNTER — Ambulatory Visit
